# Patient Record
Sex: Female | Born: 1985
Health system: Southern US, Community
[De-identification: ages and names within clinical notes are randomized; demographics above are authoritative.]

## PROBLEM LIST (undated history)

## (undated) DIAGNOSIS — F819 Developmental disorder of scholastic skills, unspecified: Secondary | ICD-10-CM

## (undated) DIAGNOSIS — G4700 Insomnia, unspecified: Secondary | ICD-10-CM

## (undated) DIAGNOSIS — I10 Essential (primary) hypertension: Secondary | ICD-10-CM

## (undated) DIAGNOSIS — E119 Type 2 diabetes mellitus without complications: Secondary | ICD-10-CM

## (undated) DIAGNOSIS — Q040 Congenital malformations of corpus callosum: Secondary | ICD-10-CM

## (undated) DIAGNOSIS — R569 Unspecified convulsions: Secondary | ICD-10-CM

## (undated) DIAGNOSIS — Q039 Congenital hydrocephalus, unspecified: Secondary | ICD-10-CM

## (undated) HISTORY — DX: Essential (primary) hypertension: I10

## (undated) HISTORY — DX: Insomnia, unspecified: G47.00

## (undated) HISTORY — DX: Congenital malformations of corpus callosum: Q04.0

## (undated) HISTORY — DX: Unspecified convulsions: R56.9

## (undated) HISTORY — DX: Congenital hydrocephalus, unspecified: Q03.9

## (undated) HISTORY — PX: WISDOM TOOTH EXTRACTION: SHX21

## (undated) HISTORY — DX: Developmental disorder of scholastic skills, unspecified: F81.9

## (undated) HISTORY — DX: Type 2 diabetes mellitus without complications: E11.9

---
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
## 2006-04-13 ENCOUNTER — Ambulatory Visit: Payer: Self-pay | Admitting: Nurse Practitioner

## 2006-05-10 ENCOUNTER — Ambulatory Visit: Payer: Self-pay | Admitting: Nurse Practitioner

## 2007-04-04 ENCOUNTER — Ambulatory Visit: Payer: Self-pay | Admitting: Family Medicine

## 2007-04-04 ENCOUNTER — Encounter (INDEPENDENT_AMBULATORY_CARE_PROVIDER_SITE_OTHER): Payer: Self-pay | Admitting: Nurse Practitioner

## 2007-04-04 LAB — CONVERTED CEMR LAB
ALT: 17 units/L (ref 0–35)
Alkaline Phosphatase: 108 units/L (ref 39–117)
Basophils Absolute: 0 10*3/uL (ref 0.0–0.1)
Basophils Relative: 0 % (ref 0–1)
Lymphocytes Relative: 32 % (ref 12–46)
Lymphs Abs: 2.4 10*3/uL (ref 0.7–4.0)
MCHC: 32.3 g/dL (ref 30.0–36.0)
MCV: 84.7 fL (ref 78.0–100.0)
Monocytes Absolute: 0.5 10*3/uL (ref 0.1–1.0)
Neutro Abs: 4.7 10*3/uL (ref 1.7–7.7)
Neutrophils Relative %: 62 % (ref 43–77)
Platelets: 261 10*3/uL (ref 150–400)
Potassium: 4.3 meq/L (ref 3.5–5.3)
RDW: 13.4 % (ref 11.5–15.5)
Total Bilirubin: 0.3 mg/dL (ref 0.3–1.2)
Total Protein: 8 g/dL (ref 6.0–8.3)
WBC: 7.6 10*3/uL (ref 4.0–10.5)

## 2007-04-24 ENCOUNTER — Ambulatory Visit: Payer: Self-pay | Admitting: Internal Medicine

## 2007-04-24 ENCOUNTER — Encounter (INDEPENDENT_AMBULATORY_CARE_PROVIDER_SITE_OTHER): Payer: Self-pay | Admitting: Nurse Practitioner

## 2007-04-24 LAB — CONVERTED CEMR LAB
Cholesterol: 241 mg/dL — ABNORMAL HIGH (ref 0–200)
HDL: 54 mg/dL (ref >39)
LDL Cholesterol: 165 mg/dL — ABNORMAL HIGH (ref 0–99)
Total CHOL/HDL Ratio: 4.5
Triglycerides: 112 mg/dL (ref <150)
VLDL: 22 mg/dL (ref 0–40)

## 2008-11-10 ENCOUNTER — Encounter (INDEPENDENT_AMBULATORY_CARE_PROVIDER_SITE_OTHER): Payer: Self-pay | Admitting: Internal Medicine

## 2008-11-10 ENCOUNTER — Ambulatory Visit: Payer: Self-pay | Admitting: Family Medicine

## 2008-11-10 LAB — CONVERTED CEMR LAB
AST: 13 units/L (ref 0–37)
Alkaline Phosphatase: 106 units/L (ref 39–117)
Carbamazepine Lvl: 8.8 ug/mL (ref 4.0–12.0)
Chloride: 107 meq/L (ref 96–112)
Creatinine, Ser: 0.65 mg/dL (ref 0.40–1.20)
Glucose, Bld: 177 mg/dL — ABNORMAL HIGH (ref 70–99)
Hemoglobin: 12.1 g/dL (ref 12.0–15.0)
Lymphs Abs: 3.8 10*3/uL (ref 0.7–4.0)
Monocytes Absolute: 0.8 10*3/uL (ref 0.1–1.0)
Neutrophils Relative %: 66 % (ref 43–77)
Potassium: 3.8 meq/L (ref 3.5–5.3)
RDW: 13.1 % (ref 11.5–15.5)
Sodium: 142 meq/L (ref 135–145)
Total Bilirubin: 0.2 mg/dL — ABNORMAL LOW (ref 0.3–1.2)
Total Protein: 8.3 g/dL (ref 6.0–8.3)
WBC: 13.6 10*3/uL — ABNORMAL HIGH (ref 4.0–10.5)

## 2008-11-28 ENCOUNTER — Ambulatory Visit: Payer: Self-pay | Admitting: Internal Medicine

## 2008-11-28 ENCOUNTER — Encounter (INDEPENDENT_AMBULATORY_CARE_PROVIDER_SITE_OTHER): Payer: Self-pay | Admitting: Internal Medicine

## 2008-11-28 LAB — CONVERTED CEMR LAB
Basophils Absolute: 0.1 10*3/uL (ref 0.0–0.1)
Basophils Relative: 1 % (ref 0–1)
Eosinophils Absolute: 0 10*3/uL (ref 0.0–0.7)
Eosinophils Relative: 0 % (ref 0–5)
Lymphocytes Relative: 40 % (ref 12–46)
MCHC: 32.6 g/dL (ref 30.0–36.0)
MCV: 84.5 fL (ref 78.0–100.0)
Monocytes Absolute: 0.9 10*3/uL (ref 0.1–1.0)
Neutro Abs: 6.7 10*3/uL (ref 1.7–7.7)
Platelets: 263 10*3/uL (ref 150–400)
WBC: 12.7 10*3/uL — ABNORMAL HIGH (ref 4.0–10.5)

## 2009-01-17 HISTORY — PX: SHUNT REPLACEMENT: SHX5403

## 2009-02-16 ENCOUNTER — Ambulatory Visit: Payer: Self-pay | Admitting: Internal Medicine

## 2009-02-19 ENCOUNTER — Ambulatory Visit: Payer: Self-pay | Admitting: Internal Medicine

## 2009-02-19 LAB — CONVERTED CEMR LAB
AST: 23 units/L (ref 0–37)
CO2: 22 meq/L (ref 19–32)
Calcium: 9 mg/dL (ref 8.4–10.5)
Cholesterol: 187 mg/dL (ref 0–200)
Glucose, Bld: 89 mg/dL (ref 70–99)
LDL Cholesterol: 113 mg/dL — ABNORMAL HIGH (ref 0–99)
Potassium: 4.1 meq/L (ref 3.5–5.3)
Total Bilirubin: 0.4 mg/dL (ref 0.3–1.2)
Total CHOL/HDL Ratio: 3.5

## 2009-03-21 ENCOUNTER — Emergency Department (HOSPITAL_COMMUNITY): Admission: EM | Admit: 2009-03-21 | Discharge: 2009-03-22 | Payer: Self-pay | Admitting: Emergency Medicine

## 2009-05-31 ENCOUNTER — Encounter: Payer: Self-pay | Admitting: Emergency Medicine

## 2009-05-31 ENCOUNTER — Inpatient Hospital Stay (HOSPITAL_COMMUNITY): Admission: EM | Admit: 2009-05-31 | Discharge: 2009-06-05 | Payer: Self-pay | Admitting: Emergency Medicine

## 2009-06-10 ENCOUNTER — Emergency Department (HOSPITAL_COMMUNITY): Admission: EM | Admit: 2009-06-10 | Discharge: 2009-06-11 | Payer: Self-pay | Admitting: Emergency Medicine

## 2009-06-23 ENCOUNTER — Inpatient Hospital Stay (HOSPITAL_COMMUNITY): Admission: EM | Admit: 2009-06-23 | Discharge: 2009-06-25 | Payer: Self-pay | Admitting: Emergency Medicine

## 2009-08-11 ENCOUNTER — Ambulatory Visit: Payer: Self-pay | Admitting: Family Medicine

## 2010-04-05 LAB — POCT I-STAT, CHEM 8
Calcium, Ion: 1.15 mmol/L (ref 1.12–1.32)
Creatinine, Ser: 0.4 mg/dL (ref 0.4–1.2)
Glucose, Bld: 166 mg/dL — ABNORMAL HIGH (ref 70–99)
Hemoglobin: 11.9 g/dL — ABNORMAL LOW (ref 12.0–15.0)
Potassium: 3.7 mEq/L (ref 3.5–5.1)

## 2010-04-05 LAB — BASIC METABOLIC PANEL
BUN: 6 mg/dL (ref 6–23)
BUN: 4 mg/dL — ABNORMAL LOW (ref 6–23)
BUN: 11 mg/dL (ref 6–23)
CO2: 24 mEq/L (ref 19–32)
Calcium: 9.1 mg/dL (ref 8.4–10.5)
Chloride: 110 mEq/L (ref 96–112)
GFR calc non Af Amer: >60 mL/min (ref >60)
GFR calc non Af Amer: >60 mL/min (ref >60)
Glucose, Bld: 141 mg/dL — ABNORMAL HIGH (ref 70–99)
Glucose, Bld: 141 mg/dL — ABNORMAL HIGH (ref 70–99)
Glucose, Bld: 140 mg/dL — ABNORMAL HIGH (ref 70–99)
Potassium: 3.8 mEq/L (ref 3.5–5.1)
Potassium: 3.6 mEq/L (ref 3.5–5.1)
Potassium: 3.8 mEq/L (ref 3.5–5.1)
Sodium: 137 mEq/L (ref 135–145)
Sodium: 143 mEq/L (ref 135–145)

## 2010-04-05 LAB — DIFFERENTIAL
Basophils Absolute: 0.1 10*3/uL (ref 0.0–0.1)
Basophils Relative: 0 % (ref 0–1)
Eosinophils Absolute: 0 10*3/uL (ref 0.0–0.7)
Eosinophils Relative: 0 % (ref 0–5)
Lymphocytes Relative: 15 % (ref 12–46)
Lymphs Abs: 1.8 10*3/uL (ref 0.7–4.0)
Lymphs Abs: 2.6 10*3/uL (ref 0.7–4.0)
Monocytes Absolute: 0.8 10*3/uL (ref 0.1–1.0)
Monocytes Absolute: 0.9 10*3/uL (ref 0.1–1.0)
Monocytes Absolute: 1.2 10*3/uL — ABNORMAL HIGH (ref 0.1–1.0)
Monocytes Relative: 7 % (ref 3–12)
Monocytes Relative: 7 % (ref 3–12)
Neutro Abs: 11.7 10*3/uL — ABNORMAL HIGH (ref 1.7–7.7)
Neutrophils Relative %: 81 % — ABNORMAL HIGH (ref 43–77)
Neutrophils Relative %: 75 % (ref 43–77)

## 2010-04-05 LAB — GRAM STAIN

## 2010-04-05 LAB — CSF CULTURE W GRAM STAIN: Gram Stain: NONE SEEN

## 2010-04-05 LAB — URINALYSIS, ROUTINE W REFLEX MICROSCOPIC
Bilirubin Urine: NEGATIVE
Glucose, UA: NEGATIVE mg/dL
Hgb urine dipstick: NEGATIVE
Hgb urine dipstick: NEGATIVE
Nitrite: NEGATIVE
Protein, ur: NEGATIVE mg/dL
Specific Gravity, Urine: 1.017 (ref 1.005–1.030)
Urobilinogen, UA: 1 mg/dL (ref 0.0–1.0)
pH: 5.5 (ref 5.0–8.0)

## 2010-04-05 LAB — CBC
HCT: 38.4 % (ref 36.0–46.0)
HCT: 34.8 % — ABNORMAL LOW (ref 36.0–46.0)
HCT: 36.3 % (ref 36.0–46.0)
HCT: 39 % (ref 36.0–46.0)
Hemoglobin: 11.7 g/dL — ABNORMAL LOW (ref 12.0–15.0)
Hemoglobin: 12.5 g/dL (ref 12.0–15.0)
MCV: 85 fL (ref 78.0–100.0)
MCV: 85.2 fL (ref 78.0–100.0)
MCV: 85 fL (ref 78.0–100.0)
Platelets: 262 10*3/uL (ref 150–400)
RBC: 4.57 MIL/uL (ref 3.87–5.11)
RBC: 4.08 MIL/uL (ref 3.87–5.11)
RDW: 13.5 % (ref 11.5–15.5)
RDW: 13.6 % (ref 11.5–15.5)
RDW: 14.1 % (ref 11.5–15.5)
WBC: 14.4 10*3/uL — ABNORMAL HIGH (ref 4.0–10.5)
WBC: 14.6 10*3/uL — ABNORMAL HIGH (ref 4.0–10.5)
WBC: 16.8 10*3/uL — ABNORMAL HIGH (ref 4.0–10.5)

## 2010-04-05 LAB — GLUCOSE, CAPILLARY
Glucose-Capillary: 99 mg/dL (ref 70–99)
Glucose-Capillary: 108 mg/dL — ABNORMAL HIGH (ref 70–99)
Glucose-Capillary: 109 mg/dL — ABNORMAL HIGH (ref 70–99)
Glucose-Capillary: 112 mg/dL — ABNORMAL HIGH (ref 70–99)
Glucose-Capillary: 117 mg/dL — ABNORMAL HIGH (ref 70–99)
Glucose-Capillary: 125 mg/dL — ABNORMAL HIGH (ref 70–99)
Glucose-Capillary: 126 mg/dL — ABNORMAL HIGH (ref 70–99)
Glucose-Capillary: 132 mg/dL — ABNORMAL HIGH (ref 70–99)
Glucose-Capillary: 134 mg/dL — ABNORMAL HIGH (ref 70–99)
Glucose-Capillary: 144 mg/dL — ABNORMAL HIGH (ref 70–99)
Glucose-Capillary: 144 mg/dL — ABNORMAL HIGH (ref 70–99)
Glucose-Capillary: 148 mg/dL — ABNORMAL HIGH (ref 70–99)
Glucose-Capillary: 175 mg/dL — ABNORMAL HIGH (ref 70–99)
Glucose-Capillary: 212 mg/dL — ABNORMAL HIGH (ref 70–99)
Glucose-Capillary: 228 mg/dL — ABNORMAL HIGH (ref 70–99)
Glucose-Capillary: 188 mg/dL — ABNORMAL HIGH (ref 70–99)

## 2010-04-05 LAB — CSF CELL COUNT WITH DIFFERENTIAL: Tube #: 4

## 2010-04-05 LAB — PROTIME-INR: INR: 1.14 (ref 0.00–1.49)

## 2010-04-05 LAB — URINE MICROSCOPIC-ADD ON

## 2010-04-05 LAB — MRSA PCR SCREENING: MRSA by PCR: NEGATIVE

## 2010-04-12 LAB — POCT I-STAT, CHEM 8
BUN: 11 mg/dL (ref 6–23)
Creatinine, Ser: 0.5 mg/dL (ref 0.4–1.2)
Glucose, Bld: 121 mg/dL — ABNORMAL HIGH (ref 70–99)
Hemoglobin: 11.9 g/dL — ABNORMAL LOW (ref 12.0–15.0)
Sodium: 140 mEq/L (ref 135–145)
TCO2: 24 mmol/L (ref 0–100)

## 2010-04-12 LAB — GLUCOSE, CAPILLARY: Glucose-Capillary: 168 mg/dL — ABNORMAL HIGH (ref 70–99)

## 2010-06-04 NOTE — H&P (Signed)
NAME:  Katelyn [name], Katelyn [name]                       ACCOUNT NO.:  [id number]   MEDICAL RECORD NO.:  [id number]                   PATIENT TYPE:  EMS   LOCATION:  MAJO                                 FACILITY:  [facility]   PHYSICIAN:  Payton Doughty, M.D.                   DATE OF BIRTH:  02/13/1985   DATE OF ADMISSION:  04/15/2002  DATE OF DISCHARGE:                                HISTORY & PHYSICAL   ADMISSION DIAGNOSES:  Shunt malfunction.   HISTORY OF PRESENT ILLNESS:  This is a 25 year old, right-handed, black  child that was born a month prematurely.  She had a shunt placed in [place],  [place], some 15 years ago and has had no difficulty since then.  She  has a seizure disorder.  Her mother had noted what she calls increased  seizures.  She was seen by Deanna Artis. Hickling, M.D., a week ago.  Today,  she had a witnessed seizure that lasted 30 minutes at school.  They put her  on the bus and sent her home.  She had another seizure on the bus which  lasted 30 minutes.  They were described as her normal seizures, but longer  in duration.  She was moving both sides instead of one side which was her  normal seizure pattern.  She came to the emergency room.  CT scanning,  although technically well done, was not a lot of help because of the unknown  history, but is shows basically agenesis of the corpus callosum, a high-  riding cerebellum, and a shunt in place in a common ventricular cavity.  The  shunt series demonstrated probable disconnection in the neck.   PAST MEDICAL HISTORY:  Otherwise remarkable for the seizures for which she  is on Tegretol and a little bit of hypertension for which she is on  lisinopril and hydrochlorothiazide (just started).  She is also on a new  seizure medicine that her mother does not know the name of and she has not  started yet.   ALLERGIES:  She does not have any allergies.   PAST SURGICAL HISTORY:  Her only operation has been her shunt.   SOCIAL  HISTORY:  She does not smoke or drink.  She functions at about the  level of a 24 year old.  She is in a special school.   PHYSICAL EXAMINATION:  HEENT:  Papilledema bilaterally.  She does not  complain of a headache.  NECK:  Supple.  There is a palpable defect in the shunt tubing in the  cervical area.  CHEST:  Clear.  CARDIAC:  Regular rate and rhythm.  ABDOMEN:  Nontender.  NEUROLOGIC:  She awakens, looks around, and answers simple questions.  Her  pupils are equal, round, and reactive to light. She appears to have good  extraocular movements, but with some paresis of upgaze.  Her facial movement  is intact.  On  motor exam, she can move all fours, but she follows commands  very sluggishly.   Her shunt series shows disconnection as noted above.    CLINICAL IMPRESSION:  Probable shunt malfunction.  Certainly with the  disconnection and papilledema, it probably needs to be reexplored and a new  shunt placed.  I think that should be done urgently.  The risks and benefits  of this approach have been discussed with her mother and she wishes to  proceed.                                               Payton Doughty, M.D.    MWR/MEDQ  D:  04/15/2002  T:  04/15/2002  Job:  [id number]

## 2010-06-04 NOTE — Consult Note (Signed)
NAME:  Katelyn [name], Katelyn [name]                       ACCOUNT NO.:  [id number]   MEDICAL RECORD NO.:  [id number]                   PATIENT TYPE:  INP   LOCATION:  3172                                 FACILITY:  [facility]   PHYSICIAN:  Genene Churn. Love, M.D.                 DATE OF BIRTH:  11-18-1985   DATE OF CONSULTATION:  04/15/2002  DATE OF DISCHARGE:                                   CONSULTATION   REASON FOR ADMISSION:  This 25 year old mentally retarded black female was  admitted from the emergency room for evaluation of recurrent seizures and  shunt failure in treatment of hydrocephalus.   HISTORY OF PRESENT ILLNESS:  The patient was born a 9 pound product of a  premature pregnancy. She was delivered by cesarean section because of known  hydrocephalus at birth. It was initially not suspected that she would  survive, but she underwent a shunt at 74 days of age. She has had this shunt  for 15 years.   She had slow developmental motor milestones, walking at 1-1/2 years, talking  at 3 to 4 years and attending the 9th grade in special education. She knows  her numbers and colors but cannot read or write except for her name. She  does dress herself, feed herself, bathe herself and take care of her toilet  needs.   She began having seizures at 22 months  of age while she and her mother  lived in [place], [place]. At that time she was placed on phenobarbital.  After moving to [facility] in 1996, she was changed by Dr. Sharene Skeans to  Tegretol. She has been having  seizures about once per month at the time of  her menstrual cycle, but more recently the seizures have increased to as  frequently as 2 times per week. Each seizure would usually last 2 to 3  minutes. This day she had 3 seizures, one lasting 10 minutes at school, one  lasting a few minutes in the bus and one lasting probably 30 minutes in the  bus.   She had been scheduled by [facility], her primary care physicians, to have  a   neurosurgical consultation on April 22, 2002, but came to the emergency  room with her seizures. A CT scan showed disconnection of the shunt and she  was seen by the neurosurgeons who felt that she had papilledema. Her  capillary blood glucose was 176. The patient has not been complaining of  headaches or nausea or vomiting. She is now status post shunt revision in  the left neck and skull.   PAST MEDICAL HISTORY:  1. Hydrocephalus.  2. Mental retardation.  3. Seizures.  4. Hypertension.   PHYSICAL EXAMINATION:  NEUROLOGIC:  A well developed black female who was in  the recovery room. She had an unusual shaped head with multiple scars and  had had the left side of her head shaved. Mental status:  She would give her  name and she would follow some commands. She was not oriented to year or to  month. Her cranial nerve examination revealed that both disks were seen and  I thought they were flat. Pupils reacted from 3 to 2.5 bilaterally and were  slowly reactive. The extraocular movements revealed an exotropia but she had  full doll's eyes. She may have had some decreased duck gaze. Tongue was  midline. Uvula was midline. Gags were present. She moved all extremities.  She felt pinprick. Deep tendon reflexes were 2+. Plantar responses were  downgoing.   LABORATORY DATA:  White blood cell count 16,600, hemoglobin 11.5, hematocrit  34.2, platelets 279, 000. Sodium 139, potassium 3.7, chloride 103, CO2  content 24, BUN 10, creatinine 0.7, glucose 166, alkaline phosphatase 132.  Liver function tests normal. Carba______ level 7.1.   IMPRESSION:  1. Hydrocephalus, code 331.4.  2. Multiple seizures, code 345.1.  3. Shunt failure, code 999.4.  4. Mental retardation, code 318.0.  5. Hypertension, code 796.2.   PLAN:  The plan at this time is to restart her Tegretol to 300 mg in the  morning and 600 mg q.h.s. She will be followed by Dr. Sharene Skeans.                                                Genene Churn. Sandria Manly, M.D.    JML/MEDQ  D:  04/15/2002  T:  04/16/2002  Job:  [id number]   cc:   Payton Doughty, M.D.  [address]  [place]  [place] [address]  Fax: [phone]   Deanna Artis. Sharene Skeans, M.D.  [address]  [address]  [place]  [place] [address]  Fax: [phone]

## 2010-06-04 NOTE — Op Note (Signed)
   NAME:  Katelyn [name], Katelyn [name]                       ACCOUNT NO.:  [id number]   MEDICAL RECORD NO.:  [id number]                   PATIENT TYPE:  INP   LOCATION:  3172                                 FACILITY:  [facility]   PHYSICIAN:  Payton Doughty, M.D.                   DATE OF BIRTH:  Mar 29, 1985   DATE OF PROCEDURE:  04/15/2002  DATE OF DISCHARGE:                                 OPERATIVE REPORT   PREOPERATIVE DIAGNOSIS:  Disconnected shunt in the neck.   POSTOPERATIVE DIAGNOSIS:  Disconnected shunt in the neck.   OPERATIVE PROCEDURE:  Ventriculoperitoneal shunt revision.   SURGEON:  Payton Doughty, M.D.   ANESTHESIA:  General endotracheal   PREP:  Sterile Betadine prep and scrub with alcohol wipe.   COMPLICATIONS:  None.   NURSE ASSISTANT:  [place].   PROCEDURE:  This is a 26 year old girl with a left occipital to peritoneal  shunt with radiographically documented disconnection in the neck.  She is  taken to the operating room, smoothly anesthetized, intubated, and placed in  the supine position.  The left shoulder was bumped up and the head turned  towards the right.  Following shave, prep and drape in the usual sterile  fashion, the skin was incised over the tubing just distal to the valve and  over the tubing at the clavicle.  Both of these were mobilized.  There was  good egress of CSF from the tubing connected to the valve.  The peritoneal  end was connected to a manometer, and it dropped from 40 cm down to 5 cm of  water very rapidly and then cycled thereafter with respirations, suggesting  good patency of drainage of the peritoneal tubing.  A short segment of new  peritoneal tubing was tunneled between the two mobilized segments, and  double female connectors were used to connect to the tubing distal to the  valve and the proximal end of the peritoneal tubing.  It was secured there  with 2-0 silks and tied together.  The wounds were irrigated. Hemostasis was  assured.  The skin  was closed with successive layers of 3-0 Vicryl and 3-0  nylon. Betadine Telfa dressing was applied, and the patient was returned to  the recovery room in good condition.                                               Payton Doughty, M.D.    MWR/MEDQ  D:  04/15/2002  T:  04/16/2002  Job:  [id number]

## 2012-02-05 ENCOUNTER — Encounter: Payer: Self-pay | Admitting: Family Medicine

## 2012-02-05 DIAGNOSIS — G40909 Epilepsy, unspecified, not intractable, without status epilepticus: Secondary | ICD-10-CM | POA: Insufficient documentation

## 2012-02-05 DIAGNOSIS — Q039 Congenital hydrocephalus, unspecified: Secondary | ICD-10-CM | POA: Insufficient documentation

## 2012-02-06 ENCOUNTER — Ambulatory Visit: Payer: Self-pay | Admitting: Family Medicine

## 2012-02-13 ENCOUNTER — Ambulatory Visit (INDEPENDENT_AMBULATORY_CARE_PROVIDER_SITE_OTHER): Payer: Medicare Other | Admitting: Family Medicine

## 2012-02-13 ENCOUNTER — Encounter: Payer: Self-pay | Admitting: Family Medicine

## 2012-02-13 VITALS — BP 137/94 | HR 118 | Ht 63.78 in | Wt 174.0 lb

## 2012-02-13 DIAGNOSIS — I10 Essential (primary) hypertension: Secondary | ICD-10-CM | POA: Insufficient documentation

## 2012-02-13 DIAGNOSIS — G40909 Epilepsy, unspecified, not intractable, without status epilepticus: Secondary | ICD-10-CM

## 2012-02-13 DIAGNOSIS — E119 Type 2 diabetes mellitus without complications: Secondary | ICD-10-CM

## 2012-02-13 DIAGNOSIS — Z23 Encounter for immunization: Secondary | ICD-10-CM

## 2012-02-13 MED ORDER — METOPROLOL SUCCINATE ER 100 MG PO TB24: 100.0000 mg | ORAL_TABLET | Freq: Every day | ORAL | Status: DC

## 2012-02-13 NOTE — Assessment & Plan Note (Signed)
HTN is well-controlled on current regimen. - Metoprolol needs refill today - Consider adding ACE-i in patient with HTN and DM for renoprotection at f/u visit

## 2012-02-13 NOTE — Assessment & Plan Note (Signed)
A1c has not been checked since August.  Pt on metoprolol. - Check A1c today and call patient with result.  - Continue metoprolol at current dose, and change medication management based on today's A1c - Monofilament test normal; last opthalmologic exam Mar 2013 normal - re-perform Mar 2014. - Trim toenail and monitor foot health - Consider adding ACE-I for renoprotection in pt with DM and HTN at f/u - Check lipids at f/u if not checked this year.

## 2012-02-13 NOTE — Assessment & Plan Note (Signed)
Seizures described to be at baseline, and patient is seeing neurologist annually and on carbatrol. - Continue medication at current dose. - Continue to f/u with neurologist

## 2012-02-13 NOTE — Patient Instructions (Addendum)
It was great to meet you today.  Blood pressure is well-controlled, and I am refilling your metoprolol today.  Diabetes - we are checking an A1c today, and I want her to have an eye exam in Mar 2014 as you are planning. Her foot exam was normal today, though I do recommend keeping toenails trimmed.  For her seizures, continue the medication and see neurologist sooner if needed.  It seems like they are under control.  She is getting a flu shot today. Make an appointment to see me again for a pap smear.

## 2012-02-13 NOTE — Progress Notes (Signed)
Subjective:     Patient ID: Katelyn [name], female   DOB: 11/24/1985, 27 y.o.   MRN: [id number]  CC - New patient  HPI  Alda Gaultney, pronounced "Lovenia Kim" is a 26 y.o. female with history of congenital hydrocephalus, seizure disorder, DM, and HTN here to establish care.  Her mom accompanies her and helps provide history.  Per mother, complaints today include acne on her back, 1 seizure per month, and left great toe swelling.    1. Seizures: Pt has a 2-5 minute left-sided partial seizure once monthly around the time of her period, at baseline.  She sees a neurologist and is taking carbatrol 600mg  BID.  She sees a neurologist about once annually and was last seen in September.  Mom denies current changes or new issues but knows she can take patient sooner than this September if she has any concerns.  2. Left great toe swelling: Per mom, noticed left great toe swelling/bruising at nail 2 days ago and skin peeling.  Pt denies pain or numbness.  Currently, no swelling.  3. HTN - Takes metoprolol regularly.  4. DM - Does not recall any symptoms of hyperglycemia and takes metformin regularly.  PMH: DM HTN Born with hydrocephalus Seizure disorder since 40 mo old, currently monthly  Medications: Metoprolol ER 100mg  / day Metformin 1000 mg twice daily [name] 600mg  BID  Allergies: None  Surgical hx: Shunt replaced 2011 June Dental surgery 2 weeks ago wisdom teeth removal  Hospitalizations: shunt replacement - [facility]  LMP: Ended 1 week ago Monthly and regular Never had pap smear Never has been pregnant  FH:  Cancer - maternal GM and GGM Maternal GF heart disease died heart attack at 34 Maternal GM thyroid disease Maternal uncle and GM with DM Maternal HTN Father HTN and DM  SH: Lives with mother. Denies pets. Denies smoke exposure Denies tobacco, drugs, or alcohol use Hobbies: Mostly listens to music, does lots of writing, activity books, goes shopping Went to  [employer] until 27 years old. Brushes own teeth; mom helps her bathe; fixes own drinks; mom prepares meals - takes care of self and mom helps. Denies sexual relationship. Denies concern for STDs.  Review of Systems Complains of needing glasses, baseline mild imbalance, and acne on back.  Denies dysuria, nausea, vomiting, constipation, diarrhea, stomach pain, fevers/chills, rash, or difficulty speaking.  Objective:   Physical Exam BP 137/94  Pulse 118  Ht 5' 3.78" (1.62 m)  Wt 174 lb (78.926 kg)  BMI 30.07 kg/m2 GEN: NAD CV: RRR, no murmurs, rubs, gallops PULM: CTAB, not fully participating EXTR: No LE edema or clubbing; Left great toe with a toenail growing into the tip of the toe SKIN: Back with healing acne scars; no rash NEURO: Alert, Monofilament normal bilaterally, slowed response to questions HEENT: Large forehead    Assessment:     Katelyn [name], pronounced "Lovenia Kim" is a 27 y.o. female with history of congenital hydrocephalus, seizure disorder, DM, and HTN here to establish care.     Plan:     # Health Maintenance -  - Flu shot given today - Schedule f/u for pap smear/chronic disease management

## 2012-02-15 ENCOUNTER — Telehealth: Payer: Self-pay | Admitting: Family Medicine

## 2012-02-15 NOTE — Telephone Encounter (Signed)
Called and spoke with Katelyn [name]'s mother about HgbA1c 7.0.  Was 5.9 02/2009, and mother is unsure what it has been since then until now.    Discussed continuing metformin at current 1000mg  BID and working on diet and exercise.  Mom plans to try and get a grant for Katelyn [name] to go to a gym as she has previously had success with water aerobics classes.  Discussed benefit of incorporating exercise into her daily life.  Mom also plans to try cutting back fatty foods and states Katelyn [name] already prefers water to sweet beverages.  - F/u on diet/exercise at f/u visit. - Consider diet/exercise counseling with Dr. Gerilyn Pilgrim at f/u vs visit just to discuss diet/exercise with me.

## 2012-03-23 ENCOUNTER — Ambulatory Visit: Payer: Medicare Other | Admitting: Family Medicine

## 2012-04-03 ENCOUNTER — Ambulatory Visit: Payer: Medicare Other | Admitting: Family Medicine

## 2012-04-19 ENCOUNTER — Ambulatory Visit (INDEPENDENT_AMBULATORY_CARE_PROVIDER_SITE_OTHER): Payer: Medicare Other | Admitting: Family Medicine

## 2012-04-19 ENCOUNTER — Encounter: Payer: Self-pay | Admitting: Family Medicine

## 2012-04-19 ENCOUNTER — Other Ambulatory Visit (HOSPITAL_COMMUNITY)
Admission: RE | Admit: 2012-04-19 | Discharge: 2012-04-19 | Disposition: A | Discharge status: Home / Self Care | Payer: Medicare Other | Source: Ambulatory Visit | Attending: Family Medicine | Admitting: Family Medicine

## 2012-04-19 VITALS — BP 148/99 | HR 111 | Temp 98.1°F | Ht 63.75 in | Wt 176.0 lb

## 2012-04-19 DIAGNOSIS — E119 Type 2 diabetes mellitus without complications: Secondary | ICD-10-CM

## 2012-04-19 DIAGNOSIS — R141 Gas pain: Secondary | ICD-10-CM

## 2012-04-19 DIAGNOSIS — R140 Abdominal distension (gaseous): Secondary | ICD-10-CM

## 2012-04-19 DIAGNOSIS — Z124 Encounter for screening for malignant neoplasm of cervix: Secondary | ICD-10-CM

## 2012-04-19 DIAGNOSIS — Z0000 Encounter for general adult medical examination without abnormal findings: Secondary | ICD-10-CM

## 2012-04-19 DIAGNOSIS — I10 Essential (primary) hypertension: Secondary | ICD-10-CM

## 2012-04-19 LAB — BASIC METABOLIC PANEL
BUN: 11 mg/dL (ref 6–23)
CO2: 25 mEq/L (ref 19–32)
Calcium: 9.4 mg/dL (ref 8.4–10.5)
Glucose, Bld: 143 mg/dL — ABNORMAL HIGH (ref 70–99)
Sodium: 136 mEq/L (ref 135–145)

## 2012-04-19 MED ORDER — LISINOPRIL 10 MG PO TABS: 10.0000 mg | ORAL_TABLET | Freq: Every day | ORAL | Status: DC

## 2012-04-19 MED ORDER — METFORMIN HCL 1000 MG PO TABS: 1000.0000 mg | ORAL_TABLET | Freq: Two times a day (BID) | ORAL | Status: DC

## 2012-04-19 NOTE — Patient Instructions (Addendum)
It was good to see you today Katelyn [name].  - You got a pap smear today. I will call you if these results are abnormal or send a letter if they are normal.  - For your blood pressure, I think we should collect a basic metabolic panel since the last one I have in our records was 3 years ago. I will call you if this is abnormal. - We will start another blood pressure medication to keep your kidneys healthy. - Continue working on diet and exercise. We can discuss this further at the next visit. Keep a log of the exercise you do and what you eat and bring it next time. - Also, bring all of your medications whenever you have an appointment.  - I think your belly looks okay today, but I notice it looks a little distended.  Otherwise, your exam is normal and since you feel normal, I don't think there is any emergency today. However, I recommend following up with your surgeon to take a look at it soon.  - I refilled metformin today.  - Make a nurse and lab only follow up visit in 2 weeks to check on your blood pressure and some labs. Come fasting (nothing other than water or black coffee). - Then you can see me a week or two later.

## 2012-04-21 ENCOUNTER — Encounter: Payer: Self-pay | Admitting: Family Medicine

## 2012-04-21 DIAGNOSIS — Z0000 Encounter for general adult medical examination without abnormal findings: Secondary | ICD-10-CM | POA: Insufficient documentation

## 2012-04-21 DIAGNOSIS — R140 Abdominal distension (gaseous): Secondary | ICD-10-CM | POA: Insufficient documentation

## 2012-04-21 NOTE — Assessment & Plan Note (Signed)
Possibly gaseous, but with h/o intraabdominal surgery, adhesions and (partial) obstruction possible, though unlikely with hx and exam. - Recommended nonemergent but closer follow up with patient's surgeon - Return precautions discussed

## 2012-04-21 NOTE — Progress Notes (Signed)
Subjective:     Patient ID: Katelyn [name], female   DOB: 13-Aug-1985, 27 y.o.   MRN: [id number]  CC - Complete physical exam with pap smear  HPI  Katelyn [name] is a 27 y.o. female with h/o congenital hydrocephalus, DM, HTN, and seizure disorder here for a complete physical exam with pap smear.  1. Mom also notes that Katelyn [name]'s stomach seems a bit bloated/larger than normal but denies redness, fevers, chills, appetite changes, menstrual changes (LMP ended last Sat) or sexual activity. She does not think Katelyn [name] has been in pain or has other complaints. Katelyn [name] does not feel uncomfortable either. She denies risk of STD or pregnancy, stating Katelyn [name] is not sexually active.   2. DM and HTN - Katelyn [name] has had increased urinary frequency but no burning. She is taking metformin and metoprolol daily. She walks daily with mom and they have tried decreasing fried foods to once weekly. Dietary habits include lots of fruits ,2% milk, high fiber diet, and few unhealthy snacks.  Review of Systems - Per HPI  PMH, SH, and FH reviewed with no changes     Objective:   Physical Exam BP 148/99  Pulse 111  Temp(Src) 98.1 F (36.7 C) (Oral)  Ht 5' 3.75" (1.619 m)  Wt 176 lb (79.833 kg)  BMI 30.46 kg/m2  LMP 04/10/2012  GEN: NAD, sitting on exam table HEENT: Hydrocephalus with very large forehead, EOMI, MMM, Sclera clear CV: RRR, no m/r/g ABD: soft, nontender, mildly distended above umbilicus with scars from previous surgery, NABS GU: Normal external genitalia, cervix visualized and wnl on speculum exam, bimanual exam WNL with no adnexal or cervical tenderness or masses, mild amount of bleeding after exam     Assessment:     Katelyn [name] is a 27 y.o. female with h/o congenital hydrocephalus, DM, HTN, and seizure disorder here for a complete physical exam with pap smear.    Plan:

## 2012-04-21 NOTE — Assessment & Plan Note (Addendum)
With urinary frequency, possibly suboptimal DM control. Last A1c <3 months ago, so will wait and recheck in 2 weeks  - Check A1c and fasting lipid panel at nurse visit in 2 weeks - Has appt later in April to see ophthalmologist

## 2012-04-21 NOTE — Assessment & Plan Note (Addendum)
BP mildly elevated today.  - BMET to check creatinine - Started lisinopril 10mg  daily for renoprotection with DM and for better BP control - If Cr elevated, will discontinue - Return for nurse visit in 2 weeks to check BMET and BP - F/u with me in 1-2 months to f/u on weight loss efforts (asked to bring log). Consider meeting with Dr Gerilyn Pilgrim at that time

## 2012-04-21 NOTE — Assessment & Plan Note (Addendum)
-   Pap smear today - Fasting lipid panel on return in 2 wks

## 2012-04-24 ENCOUNTER — Encounter: Payer: Self-pay | Admitting: Family Medicine

## 2012-05-07 ENCOUNTER — Other Ambulatory Visit: Payer: Medicare Other

## 2012-05-08 ENCOUNTER — Other Ambulatory Visit: Payer: Medicare Other

## 2012-05-10 ENCOUNTER — Encounter: Payer: Self-pay | Admitting: Family Medicine

## 2012-05-15 ENCOUNTER — Other Ambulatory Visit (INDEPENDENT_AMBULATORY_CARE_PROVIDER_SITE_OTHER): Payer: Medicare Other

## 2012-05-15 DIAGNOSIS — E119 Type 2 diabetes mellitus without complications: Secondary | ICD-10-CM

## 2012-05-15 DIAGNOSIS — I10 Essential (primary) hypertension: Secondary | ICD-10-CM

## 2012-05-15 DIAGNOSIS — Z0000 Encounter for general adult medical examination without abnormal findings: Secondary | ICD-10-CM

## 2012-05-15 LAB — BASIC METABOLIC PANEL
CO2: 24 mEq/L (ref 19–32)
Calcium: 9.2 mg/dL (ref 8.4–10.5)
Creat: 0.62 mg/dL (ref 0.50–1.10)
Glucose, Bld: 123 mg/dL — ABNORMAL HIGH (ref 70–99)
Sodium: 142 mEq/L (ref 135–145)

## 2012-05-15 LAB — LIPID PANEL
Cholesterol: 222 mg/dL — ABNORMAL HIGH (ref 0–200)
HDL: 57 mg/dL (ref >39)

## 2012-05-15 LAB — POCT GLYCOSYLATED HEMOGLOBIN (HGB A1C): Hemoglobin A1C: 6.7

## 2012-05-15 NOTE — Progress Notes (Signed)
BMP,FLP AND A1C DONE TODAY Katelyn [name] 

## 2012-05-23 ENCOUNTER — Telehealth: Payer: Self-pay | Admitting: Family Medicine

## 2012-05-23 NOTE — Telephone Encounter (Signed)
Called pt's mother to discuss results.   - While cholesterol is elevated, LDL is not high enough to initiate treatment and pt does not qualify for statin treatment by new guidelines so will hold off for now. ASCVD 10 year risk <1%.  - A1c 6.7 down from 7 at last check. On Metformin 1000mg  BID. Will continue this treatment and discussed with mom that we should continue striving for weight loss. With young age, would also consider adding glipizide at f/u.  Mom understands and is on board with this plan.  Simone Curia 05/23/2012 5:13 PM

## 2012-05-28 ENCOUNTER — Other Ambulatory Visit: Payer: Self-pay | Admitting: Family

## 2012-05-28 DIAGNOSIS — G40319 Generalized idiopathic epilepsy and epileptic syndromes, intractable, without status epilepticus: Secondary | ICD-10-CM

## 2012-05-28 MED ORDER — CARBAMAZEPINE ER 300 MG PO CP12: ORAL_CAPSULE | ORAL | Status: DC

## 2012-05-31 ENCOUNTER — Ambulatory Visit: Payer: Medicare Other | Admitting: Family Medicine

## 2012-06-01 ENCOUNTER — Ambulatory Visit (INDEPENDENT_AMBULATORY_CARE_PROVIDER_SITE_OTHER): Payer: Medicare Other | Admitting: Family Medicine

## 2012-06-01 ENCOUNTER — Encounter: Payer: Self-pay | Admitting: Family Medicine

## 2012-06-01 VITALS — BP 134/92 | HR 111 | Temp 98.1°F | Ht 63.75 in | Wt 167.0 lb

## 2012-06-01 DIAGNOSIS — E119 Type 2 diabetes mellitus without complications: Secondary | ICD-10-CM

## 2012-06-01 DIAGNOSIS — R142 Eructation: Secondary | ICD-10-CM

## 2012-06-01 DIAGNOSIS — Z0000 Encounter for general adult medical examination without abnormal findings: Secondary | ICD-10-CM

## 2012-06-01 DIAGNOSIS — Z23 Encounter for immunization: Secondary | ICD-10-CM

## 2012-06-01 DIAGNOSIS — R140 Abdominal distension (gaseous): Secondary | ICD-10-CM

## 2012-06-01 DIAGNOSIS — E663 Overweight: Secondary | ICD-10-CM

## 2012-06-01 DIAGNOSIS — Z6825 Body mass index (BMI) 25.0-25.9, adult: Secondary | ICD-10-CM

## 2012-06-01 DIAGNOSIS — I10 Essential (primary) hypertension: Secondary | ICD-10-CM

## 2012-06-01 MED ORDER — TETANUS-DIPHTH-ACELL PERTUSSIS 5-2.5-18.5 LF-MCG/0.5 IM SUSP: 0.5000 mL | Freq: Once | INTRAMUSCULAR | Status: DC

## 2012-06-01 NOTE — Progress Notes (Signed)
Patient ID: Quetzali Heinle, female   DOB: 1985/09/26, 27 y.o.   MRN: [id number]  Subjective:    CC: weight loss and BP  HPI: Sadia Belfiore is a 27 y.o. female with h/o obesity, congenital hydrocephalus with seizure disorder, diabetes and hypertension here for follow-up of BP and weight loss.  1. Weight - Pt and mom have been exercising to exercise [employer], eating grapefruit, [place], wheat bread, cutting out mayo, eating sadnwiches, more Baked foods, and alloing themselves 1 cheat day weekly for the last 1 month. Patient has today lost 9 lbs since last visit. Goal: Continue doing cardio for seniors 6d /week and dieting with baked and boiled foods with 1 cheat day.  2. BP - Patient takes lisinopril and metoprolol daily, denies side effects or hypotensive episodes, and mom plans to get a BP cuff to measure Rekita and her own BPs.  3. DM - A1c last checked end of April was 6.7 and patient takes metformin 1000mg  BID. Had discussed control with adding glipizide vs continued weight loss after last visit. Aiming for tighter control due to young age, though A1c right now is quite good.  Review of Systems - Per HPI; all other systems reviewed and negative.  Past Medical History  Diagnosis Date  . Hypertension   . Diabetes mellitus without complication   . Seizures   . Congenital hydrocephalus    SH: No changes FH: No changes     Objective:  Physical Exam BP 134/92  Pulse 111  Temp(Src) 98.1 F (36.7 C)  Ht 5' 3.75" (1.619 m)  Wt 167 lb (75.751 kg)  BMI 28.9 kg/m2 GEN: NAD, seated on exam table, pleasant, well-groomed and well-developed PULM: Normal effort NEURO: Normal speech, awake, alert HEENT: Large head with congenital hydrocephaulus, no changes ABD: Soft, nontender, nondistended Assessment:     Bettyjean Stefanski is a 27 y.o. female with h/o obesity, congenital hydrocephalus with seizure disorder, diabetes and hypertension here for follow-up of BP and weight loss.   Plan:     # Health maintenance:  - TDAP given today - Will send letter to dentist recommending q3 month tooth cleanings given disability per mom request; fax to [phone]  # See problem list for problem-specific plans.

## 2012-06-01 NOTE — Patient Instructions (Addendum)
It was great to see you again!  For your weight loss, you have lost 9 lbs! Hurray!!  - Goal: Continue doing cardio for seniors 6d /week and dieting with baked and boiled foods with 1 cheat day. - Continue the good work  For your blood pressure, - Get a blood pressure cuff - Write down pressures and dates and bring it next time  For her diabetes,  - Continue metformin and we will not add anything today. - Weight loss will help the control. Keep it up!  I will send the dentist a letter for cleanings every 3 months. We did a tetanus shot today.  As you leave, make an appointment to follow up with me in 3 months.  - Bring all medications in a bag to your visits. - Bring a log of of her blood pressures to the next visit.   Take care and seek immediate care sooner than 3 months if you develop any concerns.

## 2012-06-02 DIAGNOSIS — E669 Obesity, unspecified: Secondary | ICD-10-CM | POA: Insufficient documentation

## 2012-06-02 NOTE — Assessment & Plan Note (Addendum)
Patient has lost 9 lbs since last visit. BMI now <30, now overweight and not obese category. At this rate, can achieve better diabetes control with weight loss.  - Congratulated and discussed weight loss goals (see overweight problem) - Will hold off on adding glipizide at this time. Continue metformin 1000mg  BID - Diabetic foot exam at last visit; mom brought ophtho records and will scan into chart - F/u in 3 months for next A1c

## 2012-06-02 NOTE — Assessment & Plan Note (Addendum)
BP well controlled on lisinopril 10mg  and metoprolol XL 100mg  daily with normal renal function and K 04/2012 - Get a blood pressure cuff  - Write down pressures and dates and bring it next time

## 2012-06-02 NOTE — Assessment & Plan Note (Signed)
Improved and did not need to see surgeon. Likely increased gas/constipation.

## 2012-06-02 NOTE — Assessment & Plan Note (Addendum)
Lost 9 lbs since 04/2012 visit by doing exercise video with mom and eating healthier foods.  - Congratulated - Goal: Continue doing cardio for seniors 6d /week and dieting with baked and boiled foods with 1 cheat day.  - Aware of Dr. Gerilyn Pilgrim and will let me know if they would like to try this resource in the future, but want to do on own for now.

## 2012-06-25 ENCOUNTER — Encounter: Payer: Self-pay | Admitting: Family Medicine

## 2012-06-29 ENCOUNTER — Other Ambulatory Visit: Payer: Self-pay | Admitting: Family

## 2012-06-29 ENCOUNTER — Other Ambulatory Visit: Payer: Self-pay

## 2012-06-29 ENCOUNTER — Other Ambulatory Visit: Payer: Self-pay | Admitting: Family Medicine

## 2012-06-29 DIAGNOSIS — G40319 Generalized idiopathic epilepsy and epileptic syndromes, intractable, without status epilepticus: Secondary | ICD-10-CM

## 2012-06-29 MED ORDER — CARBATROL 300 MG PO CP12: ORAL_CAPSULE | ORAL | Status: DC

## 2012-06-29 NOTE — Telephone Encounter (Signed)
Mom lvm asking that refill be sent the pharmacy.

## 2012-07-26 ENCOUNTER — Other Ambulatory Visit: Payer: Self-pay

## 2012-07-28 ENCOUNTER — Other Ambulatory Visit: Payer: Self-pay | Admitting: Family Medicine

## 2012-10-03 ENCOUNTER — Encounter: Payer: Self-pay | Admitting: Family

## 2012-10-03 ENCOUNTER — Ambulatory Visit (INDEPENDENT_AMBULATORY_CARE_PROVIDER_SITE_OTHER): Payer: Medicare Other | Admitting: Family

## 2012-10-03 VITALS — BP 130/80 | HR 80 | Ht 63.0 in | Wt 162.8 lb

## 2012-10-03 DIAGNOSIS — G4700 Insomnia, unspecified: Secondary | ICD-10-CM | POA: Insufficient documentation

## 2012-10-03 DIAGNOSIS — Q043 Other reduction deformities of brain: Secondary | ICD-10-CM

## 2012-10-03 DIAGNOSIS — G40909 Epilepsy, unspecified, not intractable, without status epilepticus: Secondary | ICD-10-CM

## 2012-10-03 DIAGNOSIS — E663 Overweight: Secondary | ICD-10-CM

## 2012-10-03 DIAGNOSIS — Q039 Congenital hydrocephalus, unspecified: Secondary | ICD-10-CM

## 2012-10-03 DIAGNOSIS — Z6825 Body mass index (BMI) 25.0-25.9, adult: Secondary | ICD-10-CM

## 2012-10-03 DIAGNOSIS — R269 Unspecified abnormalities of gait and mobility: Secondary | ICD-10-CM | POA: Insufficient documentation

## 2012-10-03 DIAGNOSIS — G479 Sleep disorder, unspecified: Secondary | ICD-10-CM | POA: Insufficient documentation

## 2012-10-03 DIAGNOSIS — I10 Essential (primary) hypertension: Secondary | ICD-10-CM

## 2012-10-03 DIAGNOSIS — G40319 Generalized idiopathic epilepsy and epileptic syndromes, intractable, without status epilepticus: Secondary | ICD-10-CM | POA: Insufficient documentation

## 2012-10-03 MED ORDER — CARBATROL 300 MG PO CP12: ORAL_CAPSULE | ORAL | Status: DC

## 2012-10-03 MED ORDER — TRAZODONE HCL 50 MG PO TABS: ORAL_TABLET | ORAL | Status: DC

## 2012-10-03 NOTE — Patient Instructions (Signed)
Continue [name] without change. Let me know if Katelyn [name] has increased number or severity of her seizures. I have prescribed Trazodone for her insomnia. We will start at low dose and see how she tolerates it. Give her 1 tablet at bedtime. If this does not help after 2 weeks, call and let me know.  Continue to monitor her blood pressures and blood sugars.  Please plan to return for follow up in 1 year but be sure to call and let me know how she is doing with sleep.

## 2012-10-03 NOTE — Progress Notes (Signed)
Patient: Katelyn [name] MRN: [id number] Sex: female DOB: [birth year]  Provider: Elveria Rising, NP Location of Care: [facility]  Note type: Routine return visit  History of Present Illness: Referral Source: [facility] History from: Mother Chief Complaint: Seizure Disorder  Katelyn [name] is a 27 y.o. 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  She has focal motor seizures and intellectual delay.   Today her mother reports that Katelyn [name] has occasional brief seizures, usually associated with her menses. She has had problems with elevated blood pressure but otherwise has been healthy.  Mom is concerned because Katelyn [name] sleeps very little despite Mom's efforts at good sleep hygiene. She has tried Melatonin and Clonazepam but says that neither helped. Katelyn [name] will go to bed on time, but is awake and out of bed at least every hour. Mom says that she usually gets up, urinates and goes back to bed when told to do so. Mom says that she does not urinate frequently during the day and that her blood sugars are in good control. She gets up very early each morning and stays up. She does not sleep during the day, even when she looks and acts very tired. Mom has had her own health problems recently and is exhausted from being awake at night monitoring her activities.   Review of Systems: 12 system review was remarkable for seizure, not enough sleep and sleepiness  Past Medical History  Diagnosis Date  . Hypertension   . Diabetes mellitus without complication   . Seizures   . Congenital hydrocephalus      has VP shunt  . Insomnia   . Agenesis of corpus callosum   . 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  She has focal motor seizures and intellectual delay.  Katelyn [name] also has chronic medical problems of hypertension and diabetes.  Surgical History Past Surgical History  Procedure Laterality Date  . Shunt replacement  2011    Was readmitted 1 week later with peritoneal infection from shunt  . Wisdom tooth extraction      Family History family history includes Cancer in her maternal grandmother and paternal grandfather; Diabetes in her father, maternal grandmother, and maternal uncle; Heart disease in her maternal grandfather; Hypertension in her father and mother; Thyroid disease in her maternal grandmother. Her father is deceased from renal failure. Family History is negative migraines, seizures, cognitive impairment, blindness, deafness, birth defects, chromosomal disorder, autism.  Social History History   Social History  . Marital Status: Single    Spouse Name: N/A    Number of Children: N/A  . Years of Education: N/A   Social History Main Topics  . Smoking status: Never Smoker   . Smokeless tobacco: Never Used  . Alcohol Use: No  . Drug Use: No  . Sexual Activity: No  Other Topics Concern  . None   Social History Narrative   Lives with mother.   Denies pets.   Denies smoke exposure   Denies tobacco, drugs, or alcohol use   Hobbies: Mostly listens to  music, does lots of writing, activity books, goes shopping   Went to [employer] until 27 years old.   Brushes own teeth; mom helps her bathe; fixes own drinks; mom prepares meals - takes care of self and mom helps.   Denies sexual relationship.   Denies concern for STDs.         School comments July graduated from [employer] in 2009.  No Known Allergies  Physical Exam BP 130/80  Pulse 80  Ht 5\' 3"  (1.6 m)  Wt 162 lb 12.8 oz (73.846 kg)  BMI 28.85 kg/m2 General: well developed, well nourished female, seated in exam room, in no evident distress. She is quiet but attentive. Head: She has a misshapen head.  She has oxycephaly, plagiocephaly, and megencephaly.  She has a VP shunt and esotropia.  Neck: supple with no carotid or supraclavicular bruits. Respiratory: lungs clear to auscultation Cardiovascular: regular rate and rhythm, no murmurs  Neurologic Exam  Mental Status: Awake and fully alert.  She has mental retardation. She is able to answer some questions but is unable to volunteer information. Cranial Nerves: Fundoscopic exam reveals sharp disc margins.  Pupils equal, briskly reactive to light.  She has esotropia.  Visual fields appear full to confrontation.  Hearing intact and symmetric to whisper.  Facial sensation intact.  Face, tongue, palate move normally and symmetrically.  Neck flexion and extension normal. Motor: Normal bulk and tone.  Normal strength in all tested extremity muscles. Sensory: Intact to touch and temperature in all extremities. Coordination: Rapid alternating movements slowed in all extremities.  Finger-to-nose and heel-to-shin performed accurately bilaterally.Romberg negative. Gait and Station: Arises from chair without difficulty.  Stance is wide based.  Gait demonstrates normal stride length and balance.  Able to heel and toe walk fairly well. She could not perform a tandem walk without ataxia.  Reflexes: Diminished and symmetric.  Toes  downgoing.   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  She has focal motor seizures and intellectual delay. Her seizures are under fairly good control, with only an occasional brief seizure associated with her menses. She is taking and tolerating [name] for her seizure disorder. Katelyn [name] has ongoing problems with insomnia, despite good sleep hygiene, and trials of Melatonin and Clonazepam. I recommended a trial of low dose Trazodone and instructed mom to call me if it does not work. We may need to increase the dose. I will otherwise see her back in follow up in 1 year or sooner if needed.

## 2012-10-26 ENCOUNTER — Encounter: Payer: Self-pay | Admitting: Family Medicine

## 2012-10-26 ENCOUNTER — Ambulatory Visit (INDEPENDENT_AMBULATORY_CARE_PROVIDER_SITE_OTHER): Payer: Medicare Other | Admitting: Family Medicine

## 2012-10-26 VITALS — BP 128/88 | HR 81 | Temp 98.8°F | Ht 63.0 in | Wt 163.0 lb

## 2012-10-26 DIAGNOSIS — G40909 Epilepsy, unspecified, not intractable, without status epilepticus: Secondary | ICD-10-CM

## 2012-10-26 DIAGNOSIS — Z23 Encounter for immunization: Secondary | ICD-10-CM

## 2012-10-26 DIAGNOSIS — Z0000 Encounter for general adult medical examination without abnormal findings: Secondary | ICD-10-CM

## 2012-10-26 DIAGNOSIS — I10 Essential (primary) hypertension: Secondary | ICD-10-CM

## 2012-10-26 DIAGNOSIS — E119 Type 2 diabetes mellitus without complications: Secondary | ICD-10-CM

## 2012-10-26 LAB — BASIC METABOLIC PANEL
Calcium: 9.3 mg/dL (ref 8.4–10.5)
Potassium: 3.8 mEq/L (ref 3.5–5.3)
Sodium: 138 mEq/L (ref 135–145)

## 2012-10-26 LAB — POCT GLYCOSYLATED HEMOGLOBIN (HGB A1C): Hemoglobin A1C: 5.7

## 2012-10-26 MED ORDER — ONETOUCH ULTRASOFT LANCETS MISC: Status: DC

## 2012-10-26 MED ORDER — LISINOPRIL 10 MG PO TABS: 10.0000 mg | ORAL_TABLET | Freq: Every day | ORAL | Status: DC

## 2012-10-26 NOTE — Progress Notes (Addendum)
Patient ID: Katelyn [name], female   DOB: [birth year], 27 y.o.   MRN: [id number]  Subjective:   CC: Follow up DM and HTN  HPI:   1. HTN: Mom accompanies Katelyn [name] today and provides history. She reports patient has had some very high BPs at home in 170-180s/100s, though mostly 130-140s systolic. Denies symptoms of hyper- or hypotension such as headache, visual changes, dizziness, or fainting. Takes medication daily.  2. DM - Denies symptoms of hyper- or hypoglycemia, taking metformin daily and BGs written down are mostly 100-120s fasting. Does not drink sweet beverages except occasional watered-down soda.  3. Seizure disorder - Recently saw neurologist who notes seizure disorder under fairly good control with occasional brief seizures around time of her menses. Takes carbatrol and tolerating. Mom wonders if we can try to control periods.  Review of Systems - Per HPI.   PMH: Med review: Newly started trazodone nightly for insomnia and failure of melatonin and clonazepam, mom reports not working.   FH: BP and DM maternal and paternal multiple family members  Objective:  Physical Exam BP 128/88  Pulse 81  Temp(Src) 98.8 F (37.1 C) (Oral)  Ht 5\' 3"  (1.6 m)  Wt 163 lb (73.936 kg)  BMI 28.88 kg/m2  LMP 10/15/2012 GEN: NAD, pleasant, seated on exam table PULM: Normal effort HEENT: Oxycephaly, plagiocephaly, megalencephaly, sclera clear, esotropia SKIN: No rash or cyanosis NEURO: Alert, slightly slowed speech but at patient's baseline with mental retardation, able to stand and walk unassisted, seated with no support on exam table ABD: Soft, obese Neck: Supple   Assessment:     Katelyn [name] is a 27 y.o. female with h/o congenital hydrocephalus, seizure disorder, DM and HTN here for follow up of DM and HTN.    Plan:     # See problem list for problem-specific plans. - Flu shot and pneumovac today

## 2012-10-26 NOTE — Patient Instructions (Addendum)
It was good to see you today.  I have refilled test strips, lancets, and lisinopril. Let me know if the pharmacy says the lancets/test strips need to be ordered differently. We are checking labs today.  I will call with results of A1c and metabolic panel.  You are getting a flu shot and pneumonia shot today. Come back when convenient and we can discuss birth control options to control periods.  DASH Diet The DASH diet stands for "Dietary Approaches to Stop Hypertension." It is a healthy eating plan that has been shown to reduce high blood pressure (hypertension) in as little as 14 days, while also possibly providing other significant health benefits. These other health benefits include reducing the risk of breast cancer after menopause and reducing the risk of type 2 diabetes, heart disease, colon cancer, and stroke. Health benefits also include weight loss and slowing kidney failure in patients with chronic kidney disease.  DIET GUIDELINES  Limit salt (sodium). Your diet should contain less than 1500 mg of sodium daily.  Limit refined or processed carbohydrates. Your diet should include mostly whole grains. Desserts and added sugars should be used sparingly.  Include small amounts of heart-healthy fats. These types of fats include nuts, oils, and tub margarine. Limit saturated and trans fats. These fats have been shown to be harmful in the body. CHOOSING FOODS  The following food groups are based on a 2000 calorie diet. See your Registered Dietitian for individual calorie needs. Grains and Grain Products (6 to 8 servings daily)  Eat More Often: Whole-wheat bread, brown rice, whole-grain or wheat pasta, quinoa, popcorn without added fat or salt (air popped).  Eat Less Often: White bread, white pasta, white rice, cornbread. Vegetables (4 to 5 servings daily)  Eat More Often: Fresh, frozen, and canned vegetables. Vegetables may be raw, steamed, roasted, or grilled with a minimal amount of  fat.  Eat Less Often/Avoid: Creamed or fried vegetables. Vegetables in a cheese sauce. Fruit (4 to 5 servings daily)  Eat More Often: All fresh, canned (in natural juice), or frozen fruits. Dried fruits without added sugar. One hundred percent fruit juice ( cup [237 mL] daily).  Eat Less Often: Dried fruits with added sugar. Canned fruit in light or heavy syrup. [employer], Fish, and Poultry (2 servings or less daily. One serving is 3 to 4 oz [85-114 g]).  Eat More Often: Ninety percent or leaner ground beef, tenderloin, sirloin. Round cuts of beef, chicken breast, [place] breast. All fish. Grill, bake, or broil your meat. Nothing should be fried.  Eat Less Often/Avoid: Fatty cuts of meat, [place], or chicken leg, thigh, or wing. Fried cuts of meat or fish. Dairy (2 to 3 servings)  Eat More Often: Low-fat or fat-free milk, low-fat plain or light yogurt, reduced-fat or part-skim cheese.  Eat Less Often/Avoid: Milk (whole, 2%).Whole milk yogurt. Full-fat cheeses. Nuts, Seeds, and Legumes (4 to 5 servings per week)  Eat More Often: All without added salt.  Eat Less Often/Avoid: Salted nuts and seeds, canned beans with added salt. Fats and Sweets (limited)  Eat More Often: Vegetable oils, tub margarines without trans fats, sugar-free gelatin. Mayonnaise and salad dressings.  Eat Less Often/Avoid: Coconut oils, palm oils, butter, stick margarine, cream, half and half, cookies, candy, pie. FOR MORE INFORMATION The Dash Diet Eating Plan: www.dashdiet.org Document Released: 12/23/2010 Document Revised: 03/28/2011 Document Reviewed: 12/23/2010 [facility] Patient Information 2014 [place], [place].

## 2012-10-27 NOTE — Assessment & Plan Note (Signed)
Relatively good control with occasional short seizures around time of periods, possibly related to hormonal changes - Consider birth control per mom request - Given list of options - Asked mom to bring [place] back for appt to discuss

## 2012-10-27 NOTE — Assessment & Plan Note (Addendum)
Home recorded BPs with suboptimal control (systolic 130-140s) and occasional poor control (170s) - Check renal function with BMET **WNL. Increase to lisinopril 20mg  daily and f/u in 2 weeks with BMET** - Printed DASH diet  - Refilled lisinopril; also taking metoprolol - F/u 2 weeks to recheck

## 2012-10-27 NOTE — Assessment & Plan Note (Addendum)
Last A1c 6.7, aiming for better control in young patient.  - F/u A1c today **5.7** - Refilled lancet and strips - Eye exam and foot exam done this year 04/2012 - Continue metformin 1000mg  BID - Consider adding glipizide depending on A1c **Not at this time** - Per mom, doing best with diet and exercise. **Congratulate on A1c control, mom states sweet beverages are minimum-none**

## 2012-11-02 ENCOUNTER — Encounter: Payer: Self-pay | Admitting: Family Medicine

## 2012-11-02 NOTE — Progress Notes (Signed)
Patient ID: Katelyn [name], female   DOB: 1985-01-25, 27 y.o.   MRN: [id number] Filling out form for [employer]are department providing information about patient testing supplies and my signature stating she tests 1x/day.  Leona Singleton, MD 11/02/2012 1:11 PM

## 2012-11-15 NOTE — Progress Notes (Signed)
Patient ID: Katelyn [name], female   DOB: May 27, 1985, 27 y.o.   MRN: [id number] Filling out same form for [facility] "Physician Order - Diabetic Form" from [facility] DEPARTMENT a second time.  Leona Singleton, MD 11/15/2012 6:58 PM

## 2012-11-20 ENCOUNTER — Ambulatory Visit: Payer: Medicare Other | Admitting: Family Medicine

## 2012-12-12 ENCOUNTER — Ambulatory Visit: Payer: Medicare Other | Admitting: Family Medicine

## 2012-12-24 ENCOUNTER — Ambulatory Visit: Payer: Medicare Other | Admitting: Family Medicine

## 2013-01-26 ENCOUNTER — Other Ambulatory Visit: Payer: Self-pay | Admitting: Family Medicine

## 2013-03-08 ENCOUNTER — Ambulatory Visit: Payer: Medicare Other | Admitting: Family Medicine

## 2013-03-14 ENCOUNTER — Other Ambulatory Visit: Payer: Self-pay | Admitting: Family Medicine

## 2013-03-20 ENCOUNTER — Other Ambulatory Visit: Payer: Self-pay | Admitting: Family Medicine

## 2013-03-22 ENCOUNTER — Ambulatory Visit: Payer: Medicare Other | Admitting: Family Medicine

## 2013-03-25 ENCOUNTER — Telehealth: Payer: Self-pay | Admitting: Family Medicine

## 2013-03-25 MED ORDER — METOPROLOL SUCCINATE ER 100 MG PO TB24: 100.0000 mg | ORAL_TABLET | Freq: Every day | ORAL | Status: DC

## 2013-03-25 NOTE — Telephone Encounter (Signed)
Mother called and needs a refill on her daughters Metoprolol called in. jw

## 2013-03-25 NOTE — Telephone Encounter (Signed)
Will fill for 1 month although it seems metoprolol should be filled until mid-April. Thank you for also facilitating scheduling follow-up.  Katelyn SingletonMaria T Emogene Muratalla, MD

## 2013-03-25 NOTE — Telephone Encounter (Signed)
Pt has not been seen since October.  Called mom and appt scheduled for this Wednesday (03/27/13).  Katelyn [name], Katelyn RochesterJessica [name]

## 2013-03-25 NOTE — Addendum Note (Signed)
Addended by: Simone CuriaHEKKEKANDAM, Kyzen Horn T on: 03/25/2013 03:09 PM   Modules accepted: Orders

## 2013-03-27 ENCOUNTER — Encounter: Payer: Self-pay | Admitting: Family Medicine

## 2013-03-27 ENCOUNTER — Ambulatory Visit (INDEPENDENT_AMBULATORY_CARE_PROVIDER_SITE_OTHER): Payer: Medicare Other | Admitting: Family Medicine

## 2013-03-27 VITALS — BP 126/80 | HR 116 | Temp 98.5°F | Wt 157.0 lb

## 2013-03-27 DIAGNOSIS — I10 Essential (primary) hypertension: Secondary | ICD-10-CM

## 2013-03-27 DIAGNOSIS — E663 Overweight: Secondary | ICD-10-CM

## 2013-03-27 DIAGNOSIS — G40319 Generalized idiopathic epilepsy and epileptic syndromes, intractable, without status epilepticus: Secondary | ICD-10-CM

## 2013-03-27 DIAGNOSIS — E119 Type 2 diabetes mellitus without complications: Secondary | ICD-10-CM

## 2013-03-27 LAB — BASIC METABOLIC PANEL
BUN: 10 mg/dL (ref 6–23)
CHLORIDE: 106 meq/L (ref 96–112)
CO2: 22 mEq/L (ref 19–32)
Calcium: 9.4 mg/dL (ref 8.4–10.5)
Creat: 0.59 mg/dL (ref 0.50–1.10)
Glucose, Bld: 106 mg/dL — ABNORMAL HIGH (ref 70–99)
Potassium: 4.2 mEq/L (ref 3.5–5.3)
Sodium: 138 mEq/L (ref 135–145)

## 2013-03-27 LAB — POCT GLYCOSYLATED HEMOGLOBIN (HGB A1C): HEMOGLOBIN A1C: 5.6

## 2013-03-27 LAB — LDL CHOLESTEROL, DIRECT: Direct LDL: 119 mg/dL — ABNORMAL HIGH

## 2013-03-27 MED ORDER — LISINOPRIL 20 MG PO TABS: 20.0000 mg | ORAL_TABLET | Freq: Every day | ORAL | Status: DC

## 2013-03-27 MED ORDER — LISINOPRIL 10 MG PO TABS: 10.0000 mg | ORAL_TABLET | Freq: Every day | ORAL | Status: DC

## 2013-03-27 MED ORDER — CARBATROL 300 MG PO CP12: ORAL_CAPSULE | ORAL | Status: DC

## 2013-03-27 MED ORDER — METOPROLOL SUCCINATE ER 100 MG PO TB24: 100.0000 mg | ORAL_TABLET | Freq: Every day | ORAL | Status: DC

## 2013-03-27 NOTE — Patient Instructions (Signed)
It was great to see you today.  I am going to refill your medications for 6 months. Based on your blood pressure, continue current medication and eating healthy and we will plan to see each other again in 6 months.  We are going to check some labs today and I will call you if there are any abnormalities. Call for an eye appointment.  Have a great trip!  Katelyn [name] T Katelyn Ault, MD    DASH Diet The DASH diet stands for "Dietary Approaches to Stop Hypertension." It is a healthy eating plan that has been shown to reduce high blood pressure (hypertension) in as little as 14 days, while also possibly providing other significant health benefits. These other health benefits include reducing the risk of breast cancer after menopause and reducing the risk of type 2 diabetes, heart disease, colon cancer, and stroke. Health benefits also include weight loss and slowing kidney failure in patients with chronic kidney disease.  DIET GUIDELINES  Limit salt (sodium). Your diet should contain less than 1500 mg of sodium daily.  Limit refined or processed carbohydrates. Your diet should include mostly whole grains. Desserts and added sugars should be used sparingly.  Include small amounts of heart-healthy fats. These types of fats include nuts, oils, and tub margarine. Limit saturated and trans fats. These fats have been shown to be harmful in the body. CHOOSING FOODS  The following food groups are based on a 2000 calorie diet. See your Registered Dietitian for individual calorie needs. Grains and Grain Products (6 to 8 servings daily)  Eat More Often: Whole-wheat bread, brown rice, whole-grain or wheat pasta, quinoa, popcorn without added fat or salt (air popped).  Eat Less Often: White bread, white pasta, white rice, cornbread. Vegetables (4 to 5 servings daily)  Eat More Often: Fresh, frozen, and canned vegetables. Vegetables may be raw, steamed, roasted, or grilled with a minimal amount of fat.  Eat  Less Often/Avoid: Creamed or fried vegetables. Vegetables in a cheese sauce. Fruit (4 to 5 servings daily)  Eat More Often: All fresh, canned (in natural juice), or frozen fruits. Dried fruits without added sugar. One hundred percent fruit juice ( cup [237 mL] daily).  Eat Less Often: Dried fruits with added sugar. Canned fruit in light or heavy syrup. [employer]ockerLean Meats, Fish, and Poultry (2 servings or less daily. One serving is 3 to 4 oz [85-114 g]).  Eat More Often: Ninety percent or leaner ground beef, tenderloin, sirloin. Round cuts of beef, chicken breast, [place] breast. All fish. Grill, bake, or broil your meat. Nothing should be fried.  Eat Less Often/Avoid: Fatty cuts of meat, [place], or chicken leg, thigh, or wing. Fried cuts of meat or fish. Dairy (2 to 3 servings)  Eat More Often: Low-fat or fat-free milk, low-fat plain or light yogurt, reduced-fat or part-skim cheese.  Eat Less Often/Avoid: Milk (whole, 2%).Whole milk yogurt. Full-fat cheeses. Nuts, Seeds, and Legumes (4 to 5 servings per week)  Eat More Often: All without added salt.  Eat Less Often/Avoid: Salted nuts and seeds, canned beans with added salt. Fats and Sweets (limited)  Eat More Often: Vegetable oils, tub margarines without trans fats, sugar-free gelatin. Mayonnaise and salad dressings.  Eat Less Often/Avoid: Coconut oils, palm oils, butter, stick margarine, cream, half and half, cookies, candy, pie. FOR MORE INFORMATION The Dash Diet Eating Plan: www.dashdiet.org Document Released: 12/23/2010 Document Revised: 03/28/2011 Document Reviewed: 12/23/2010 [facility] Patient Information 2014 [place].

## 2013-03-27 NOTE — Assessment & Plan Note (Addendum)
Changed from diabetes, given controlled A1c 5.7 last visit and 5.6 today. - Continue metformin. Refilling today for 6 months. - Continue weight management. Congratulated on efforts. - Direct LDL today>>>119. Will mail letter with options of continued diet/exercise vs starting low-dose statin and f/u at next visit. - Foot exam WNL today. Call ophthalmologist for eye exam due 04/2013.

## 2013-03-27 NOTE — Progress Notes (Signed)
Patient ID: Katelyn FusiJeelisha [name], female   DOB: 20-Oct-1985, 28 y.o.   MRN: [id number] Subjective:   CC: F/u diabetes and HTN  HPI:   History provided by mom and patient.  Hypertension - Patient takes lisinopril 20mg  (increased at last visit in Oct) and metoprolol XL 100mg  daily and needs refills. She denies symptoms of low or high BP (dizziness, syncope, headache, blurred vision, chest pain, dyspnea). Denies medication side effects. Took medication this morning.  Pre-diabetes - Patient takes metformin 1000mg  BID and does not miss doses. Denies medication side effects. Denies signs/symptoms of hyper- or hypoglycemia, including blurred vision, increased urinary frequency or thirst, anxiety, diaphoresis, chest pain, or other concerns. Checks fasting blood sugar daily which ranges from 100-120s, never below 100 and never in 200s. She was last here 10/10 and had A1c 5.7. She is due for eye exam in April and foot exam now. Her mother helps be sure she gets minimal sweets (reports 2 sweet drinks per week).   H/o abdominal distension - Resolved since last visit.  Review of Systems - Per HPI.   PMH: Meds: Reviewed  SH: [place] to visit [place]. No tobacco use    Objective:  Physical Exam BP 126/80  Pulse 116  Temp(Src) 98.5 F (36.9 C) (Oral)  Wt 157 lb (71.215 kg) GEN: NAD, very pleasant, "I'm having a good day." HEENT: Atraumatic, head with plagiocephaly specifically increased size vertically with enlarged forehead, neck supple, EOMI, sclera clear  CV: RRR, no murmurs, rubs, or gallops PULM: CTAB, normal effort ABD: Soft, nontender, nondistended, no organomegaly SKIN: No rash or cyanosis; warm and well-perfused EXTR: No lower extremity edema or calf tenderness FOOT exam: No deformity or skin breaks, DP pulse 2+ bilaterally, normal sensation to microfilament bilaterally PSYCH: Mood and affect euthymic, normal rate and volume of speech NEURO: Awake, alert,  no focal deficits grossly, normal speech and gait    Assessment:     Katelyn FusiJeelisha Vantuyl is a 28 y.o. female with h/o HTN and pre-diabetes here for follow-up.    Plan:     # See problem list and after visit summary for problem-specific plans.   # Health Maintenance: Eye exam within the month; foot exam today.  Follow-up: Follow up in 6 months for f/u of pre-DM and HTN.    Katelyn SingletonMaria T Kalayna Noy, MD [facility]

## 2013-03-27 NOTE — Assessment & Plan Note (Addendum)
Controlled - initially 145/94, 126/80 on recheck.  - Continue lisinopril 20mg  daily and metoprolol XL 100 mg daily. Refill 6 months. - Checking BMET today>>>WNL - Continue healthy diet.

## 2013-03-28 ENCOUNTER — Encounter: Payer: Self-pay | Admitting: Family Medicine

## 2013-04-11 ENCOUNTER — Ambulatory Visit: Payer: Medicare Other | Admitting: Family Medicine

## 2013-06-13 ENCOUNTER — Other Ambulatory Visit: Payer: Self-pay | Admitting: Family

## 2013-06-23 ENCOUNTER — Other Ambulatory Visit: Payer: Self-pay | Admitting: Family Medicine

## 2013-07-30 ENCOUNTER — Telehealth: Payer: Self-pay | Admitting: Family Medicine

## 2013-07-30 NOTE — Telephone Encounter (Signed)
Kately[facility]atonja [facility] and wanted to know if the doctor had received the paperwork for home care for Katelyn [name]. If not or if you have please call Katelyn [name] at [phone]. jw

## 2013-07-31 NOTE — Telephone Encounter (Signed)
Thank you Tamika.  Allyna Pittsley T Jalin Erpelding, MD  

## 2013-07-31 NOTE — Telephone Encounter (Signed)
Assessment/ Order form faxed yesterday 07/30/2013 and again today 07/31/2013.  Form copied for scantning in pt's record.  Clovis PuMartin, Tamika L, RN

## 2013-08-07 ENCOUNTER — Ambulatory Visit (INDEPENDENT_AMBULATORY_CARE_PROVIDER_SITE_OTHER): Payer: Medicare Other | Admitting: Family Medicine

## 2013-08-07 VITALS — BP 138/110 | HR 79 | Temp 98.6°F | Resp 16 | Wt 158.0 lb

## 2013-08-07 DIAGNOSIS — R7303 Prediabetes: Secondary | ICD-10-CM

## 2013-08-07 DIAGNOSIS — I10 Essential (primary) hypertension: Secondary | ICD-10-CM

## 2013-08-07 DIAGNOSIS — R7309 Other abnormal glucose: Secondary | ICD-10-CM

## 2013-08-07 LAB — POCT GLYCOSYLATED HEMOGLOBIN (HGB A1C): Hemoglobin A1C: 5.8

## 2013-08-07 NOTE — Patient Instructions (Signed)
Check BPs and call me with 2 values. We are refaxing the form. I will call you with A1c and recommendations.  Best,  Leona SingletonMaria T Roi Jafari, MD

## 2013-08-07 NOTE — Progress Notes (Signed)
Patient ID: Katelyn [name], female   DOB: [birth year], 28 y.o.   MRN: [id number] Subjective:   CC: Forms, f/u BP  HPI:   Forms  Katelyn [name] states the application requesting home care services was declined because Katelyn [name] had not had an exam in some time. That is why she has brought her today.   F/u BP Mom reports she is giving Katelyn [name] meds regularly, with no side effects. No symptoms of high or low blood pressure with no blurred vision, chest pain, dyspnea, or leg swelling and no dizziness or syncope. Home BPs have been 120s/80s. No uncontrolled pain or recent illness. Peeing like normal. Eating healthier, cutting down on sweets and sodas. Walk or goes to pool, tries to go daily.  Diabetes Mellitus A1c being checked today but will fully eval at f/u.  Review of Systems - Per HPI.   SH:  Recently got back from [place], had a good time. Smoking status: No smoke exposure    Objective:  Physical Exam BP 144/108  Pulse 79  Temp(Src) 98.6 F (37 C) (Oral)  Resp 16  Wt 158 lb (71.668 kg)  SpO2 100%  LMP 08/06/2013 GEN: NAD CV: RRR, no m/r/g, 2+ B DP and radial pulses PULM: CTAB, normal effort ABD: soft, nontender    Assessment:     Katelyn [name] is a 28 y.o. female with h/o HTN, pre-diabetes, seizure disorder, congenital hydrocephalus here for form for home care and f/u HTN.    Plan:     Follow up HTN Elevated in clinic today, though reportedly normal at home with regular medication use and no chest pain or dyspnea. Recheck BP: 138/110, likely white coat HTN. - Continue metoprolol-XL and lisinopril - Asked mom to check 3 values at home this week and call. - Continue working on diet and exercise.  Follow up pre-diabetes A1c today: 5.8 from 5.7 last check. - [name]  Follow-up: Follow up within next month to discuss DM. - Form for home care filled out and will be faxed by our office.  Katelyn SingletonMaria T [name], MD [facility]

## 2013-08-10 NOTE — Assessment & Plan Note (Signed)
Elevated in clinic today, though reportedly normal at home with regular medication use and no chest pain or dyspnea. Recheck BP: 138/110, likely white coat HTN. - Continue metoprolol-XL and lisinopril - Asked mom to check 3 values at home this week and call. - Continue working on diet and exercise.

## 2013-08-10 NOTE — Assessment & Plan Note (Signed)
A1c today: 5.8 from 5.7 last check. - [facility]

## 2013-08-12 ENCOUNTER — Telehealth: Payer: Self-pay | Admitting: Family Medicine

## 2013-08-12 NOTE — Telephone Encounter (Signed)
Mother called because the forms that were faxed last week to [facility] were not received and can we re-fax them again. jw

## 2013-08-13 NOTE — Telephone Encounter (Signed)
Yes, will place copy in fax bin to be faxed today again.  Katelyn SingletonMaria T Youcef Klas, MD

## 2013-08-13 NOTE — Telephone Encounter (Signed)
Please let mom know I have faxed it again just now, twice, to both numbers listed on top of the form. I will keep a copy handy for 1 week in case they still have not received the form. Ask her to please let us know if they still do not receive it later today or tomorrow morning.  Katelyn SingletonMaria T Hildreth Orsak, MD

## 2013-08-14 NOTE — Telephone Encounter (Signed)
Pt mom informed. Katelyn [name] Dawn  

## 2013-10-03 ENCOUNTER — Encounter: Payer: Self-pay | Admitting: Family

## 2013-10-03 ENCOUNTER — Ambulatory Visit (INDEPENDENT_AMBULATORY_CARE_PROVIDER_SITE_OTHER): Payer: Medicare Other | Admitting: Family

## 2013-10-03 VITALS — BP 124/76 | HR 78 | Ht 63.0 in | Wt 152.6 lb

## 2013-10-03 DIAGNOSIS — Q039 Congenital hydrocephalus, unspecified: Secondary | ICD-10-CM

## 2013-10-03 DIAGNOSIS — Q043 Other reduction deformities of brain: Secondary | ICD-10-CM

## 2013-10-03 DIAGNOSIS — G4700 Insomnia, unspecified: Secondary | ICD-10-CM

## 2013-10-03 DIAGNOSIS — G4720 Circadian rhythm sleep disorder, unspecified type: Secondary | ICD-10-CM | POA: Insufficient documentation

## 2013-10-03 DIAGNOSIS — G479 Sleep disorder, unspecified: Secondary | ICD-10-CM

## 2013-10-03 DIAGNOSIS — G40319 Generalized idiopathic epilepsy and epileptic syndromes, intractable, without status epilepticus: Secondary | ICD-10-CM

## 2013-10-03 DIAGNOSIS — R269 Unspecified abnormalities of gait and mobility: Secondary | ICD-10-CM

## 2013-10-03 NOTE — Patient Instructions (Addendum)
Because Katelyn [name] has had ongoing problems with insomnia and waking from sleep, and medications we have tried have not helped with this, I will refer her to a sleep specialist for evaluation.   Continue her current medications without change for now.   Continue working on diet and exercise. She is doing great with her weight loss! I have printed her last 3 weights for you to see.  Wt Readings from Last 3 Encounters:  10/03/13 152 lb 9.6 oz (69.219 kg)  08/07/13 158 lb (71.668 kg)  03/27/13 157 lb (71.215 kg)   I will see her back in follow up in 1 year or sooner if needed. After she has been seen by the sleep specialist, Dr. Sharene Skeans or I will call you and talk with you about the results and recommendations. That may mean that she will need to be seen sooner than one year or she may need to follow up with the sleep specialist, depending on what the results show.   Call me if you have any questions or concerns.

## 2013-10-03 NOTE — Progress Notes (Signed)
Patient: Katelyn [name] MRN: [id number] Sex: female DOB: 1985-12-22  Provider: Elveria Rising, NP Location of Care: [facility]  Note type: Routine return visit  History of Present Illness: Referral Source: [facility] History from: her mother Chief Complaint: Seizure Disorder  Katelyn [name] is a 28 y.o. 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 She has focal motor seizures and intellectual delay. Jamesina was last seen October 03, 2012.   Today her mother reports that [name] continues to have occasional brief seizures, usually associated with her menses. She has been helping her loose weight because of problems with elevated blood pressure and blood sugars. [name] has had ongoing problems with sleep arousals. Mom says that she sleeps very little despite all efforts to help her to go to bed and stay asleep. They have been walking for exercise and Mom said that she can tell no difference. [name] was tried on Clonidine when she was younger, and then Melatonin and Clonazepam when she was older. None of these agents helped. Mom said that she has tried OTC sleep medications with no benefit. More recently she has been tried on Trazodone with no change in her sleep habits. Mom said that she will go to bed on time, but is awake and out of bed at least every hour. Mom says that she usually gets up, urinates and goes back to bed when told to do so. Mom says that she does not urinate frequently during the day and that her blood sugars are in good control. Sometimes Mom finds her in  the bathroom with no need to urinate, but simply in the bathroom. It is not clear why she is there. Mom has not seen behaviors that suggest sleepwalking, or seizure behaviors, other than at times when she is simply standing in the bathroom. Mom sends her back to bed, and about an hour later, she is up again. Chistina then gets up very early each morning and stays up. She does not sleep during the day, even when she looks and acts very tired. Mom is fatigued from waking all night and monitoring [name]'s activities.  Review of Systems: 12 system review was unremarkable  Past Medical History  Diagnosis Date  . Hypertension   . Diabetes mellitus without complication   . Seizures   . Congenital hydrocephalus     has VP shunt  . Insomnia   . Agenesis of corpus callosum   . Intellectual delay    Hospitalizations: No., Head Injury: No., Nervous System Infections: No., Immunizations up to date: Yes.   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 She has focal motor seizures and intellectual delay. [name] also has chronic medical problems of hypertension and diabetes.   Surgical History Past Surgical History  Procedure Laterality Date  . Shunt replacement  2011    Was readmitted 1 week later with peritoneal infection from shunt  . Wisdom tooth extraction  Family History family history includes Cancer in her maternal grandmother and paternal grandfather; Diabetes in her father, maternal grandmother, and maternal uncle; Heart disease in her maternal grandfather; Hypertension in her father and mother; Thyroid  disease in her maternal grandmother. Family History is otherwise negative for migraines, seizures, cognitive impairment, blindness, deafness, birth defects, chromosomal disorder, autism.  Social History History   Social History  . Marital Status: Single    Spouse Name: N/A    Number of Children: N/A  . Years of Education: N/A   Social History Main Topics  . Smoking status: Never Smoker   . Smokeless tobacco: Never Used  . Alcohol Use: No  . Drug Use: No  . Sexual Activity: No   Other Topics Concern  . None   Social History Narrative   Lives with mother.   Denies pets.   Denies smoke exposure   Denies tobacco, drugs, or alcohol use   Hobbies: Mostly listens to music, does lots of writing, activity books, goes shopping   Went to [employer] until 28 years old.   Brushes own teeth; mom helps her bathe; fixes own drinks; mom prepares meals - takes care of self and mom helps.   Denies sexual relationship.   Denies concern for STDs.            Educational level: 12th grade School Attending:N/A Living with:  mother  Hobbies/Interest: likes music, movies and walking School comments: [name] is being cared for at home.  Physical Exam BP 124/76  Pulse 78  Ht  (1.6 m)  Wt 152 lb 9.6 oz (69.219 kg)  BMI 27.04 kg/m2  LMP 09/19/2013 General: well developed, well nourished female, seated in exam room, in no evident distress. She is quiet but attentive.  Head: She has a misshapen head. She has oxycephaly, plagiocephaly, and megencephaly. She has a VP shunt and esotropia.  Neck: supple with no carotid or supraclavicular bruits.  Respiratory: lungs clear to auscultation  Cardiovascular: regular rate and rhythm, no murmurs   Neurologic Exam  Mental Status: Awake and fully alert. She has mental retardation. She is able to answer some questions but is unable to volunteer information.  Cranial Nerves: Fundoscopic exam reveals sharp disc margins. Pupils equal, briskly  reactive to light. She has esotropia. Visual fields appear full to confrontation. Hearing intact and symmetric to whisper. Facial sensation intact. Face, tongue, palate move normally and symmetrically. Neck flexion and extension normal.  Motor: Normal bulk and tone. Normal strength in all tested extremity muscles.  Sensory: Intact to touch and temperature in all extremities.  Coordination: Rapid alternating movements slowed in all extremities. Finger-to-nose and heel-to-shin performed accurately bilaterally.Romberg negative.  Gait and Station: Arises from chair without difficulty. Stance is wide based. Gait demonstrates normal stride length and balance. Able to heel and toe walk fairly well. She could not perform a tandem walk without ataxia.  Reflexes: Diminished and symmetric. Toes downgoing.   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 She has focal motor seizures and intellectual delay. Her seizures are under fairly good control, with only an occasional brief seizure associated with her menses. She is taking and tolerating [name] for her seizure disorder,  and I will not make changes in her epilepsy treatment at this time. Tala has ongoing problems with insomnia and sleep arousals, despite good sleep hygiene. She has been tried on Clonidine, Melatonin, Clonazepam, OTC medications and currently Trazodone. I am concerned about her lack of sleep and recommended that we refer her for an evaluation by a sleep specialist. While it is not evident that she is having any seizure behaviors in her  sleep that awaken her, it would be beneficial to evaluate this as well as part of the sleep study. I will contact her mother when I receive the sleep study results. Otherwise, we will plan to see Yomaris back in follow up in 1 year or sooner if needed.

## 2013-10-07 ENCOUNTER — Institutional Professional Consult (permissible substitution): Payer: Medicare Other | Admitting: Neurology

## 2013-10-12 ENCOUNTER — Other Ambulatory Visit: Payer: Self-pay | Admitting: Family Medicine

## 2013-11-01 ENCOUNTER — Institutional Professional Consult (permissible substitution): Payer: Medicare Other | Admitting: Neurology

## 2013-11-13 ENCOUNTER — Other Ambulatory Visit: Payer: Self-pay | Admitting: Family Medicine

## 2013-12-04 ENCOUNTER — Ambulatory Visit (INDEPENDENT_AMBULATORY_CARE_PROVIDER_SITE_OTHER): Payer: Medicare Other | Admitting: *Deleted

## 2013-12-04 ENCOUNTER — Encounter: Payer: Self-pay | Admitting: Family Medicine

## 2013-12-04 ENCOUNTER — Ambulatory Visit (INDEPENDENT_AMBULATORY_CARE_PROVIDER_SITE_OTHER): Payer: Medicare Other | Admitting: Family Medicine

## 2013-12-04 VITALS — BP 138/94 | HR 108 | Temp 98.6°F | Ht 63.0 in | Wt 154.0 lb

## 2013-12-04 DIAGNOSIS — I10 Essential (primary) hypertension: Secondary | ICD-10-CM

## 2013-12-04 DIAGNOSIS — R7309 Other abnormal glucose: Secondary | ICD-10-CM

## 2013-12-04 DIAGNOSIS — G40909 Epilepsy, unspecified, not intractable, without status epilepticus: Secondary | ICD-10-CM

## 2013-12-04 DIAGNOSIS — Z23 Encounter for immunization: Secondary | ICD-10-CM

## 2013-12-04 DIAGNOSIS — R7303 Prediabetes: Secondary | ICD-10-CM

## 2013-12-04 DIAGNOSIS — S99922A Unspecified injury of left foot, initial encounter: Secondary | ICD-10-CM

## 2013-12-04 LAB — POCT GLYCOSYLATED HEMOGLOBIN (HGB A1C): Hemoglobin A1C: 5.7

## 2013-12-04 MED ORDER — CARBATROL 300 MG PO CP12: ORAL_CAPSULE | ORAL | Status: DC

## 2013-12-04 NOTE — Progress Notes (Addendum)
Patient ID: Paulina FusiJeelisha Brunty, female   DOB: Sep 25, 1985, 28 y.o.   MRN: [id number] Subjective:   CC: F/u preDM and HTN  HPI:   Seen July for prediabetes and HTN  HTN She has taken metoprolol XL and lisinopril regularly. BPs checked at home, highest value 145 systolic x 1, usually 120-125/80s. Most recent creatinine normal. Diet: Cut out sodas, watching carbs, she does not like breads or potatoes. Exercise: Walking 15 minutes 3-4 times weekly. Denies chest pain, dyspnea, leg swelling, blurred vision, fainting.  Prediabetes  A1c 5.8 last visit from 5.6. 5.7 today. Abdominal fat is hard to get rid of per mom.  Taking metformin regularly. Avoids sodas. Dilutes other sweet beverages. Walking about 15 minutes at a time, 3-4 times weekly. Denies chest pain, dyspnea, leg swelling, blurred vision, or fainting.  Seizure disorder Seen 9/17 by pediatric neurology and seizures under good control with occasional brief seizure associated with menses. Tolerating carbatrol and no changes were made. Ongoing problems with insomnia and lseep arousal despite hygiene. Tried multiple meds and on trazodone currently. Planned sleep evaluation. Seizure last week, hit big toe on left fot, knocked top of nail off.  Had seizure 1 week ago and hit left great toe. Nail broke in half. Have been using antibiotic ointment and keeping bandaid on at night and part of day. No fevers/chills or pain or exudate.  Review of Systems - Per HPI. Additionally, would like another appt to discuss starting OCP. No h/o dizziness/fainting/chest pain or FH of sudden death/cardiac death in child.  PMH- HTN, prediabetes, seizures, congenital hydrocephalus with VP shunt, insomnia, agenesis of corpus callosum, and intellectual delay.  Smoking status: Nonsmoker    Objective:  Physical Exam BP 138/94 mmHg  Pulse 108  Temp(Src) 98.6 F (37 C) (Oral)  Ht 5\' 3"  (1.6 m)  Wt 154 lb (69.854 kg)  BMI 27.29 kg/m2  LMP 11/11/2013  (Approximate) GEN: NAD CV: RRR, possible subtle II/VI systolic murmur, 2+ B radial and DP pulses PULM: CTAB, normal effort HEENT: Sclera clear, PERRL, EOMI, enlarged cranium EXTR: No LE edema or calf tenderness Left great toe with 1/4 of toe missing, nailbed is erythematous with very small amount of dried mucopurulent discharge, no tenderness; skin around nailbed is hypopigmented and macerated appearing, but not erythematous or tender    Assessment:     Paulina FusiJeelisha Hartin is a 28 y.o. female here for f/u HTN and prediabetes.    Plan:     # See problem list and after visit summary for problem-specific plans.   # Health Maintenance: Flu shot today. - Would like to discuss birth control options at follow up. List of options given. - Listen again for possible subtle murmur.  Follow-up: Follow up in 4 months for f/u HTN.   Leona SingletonMaria T Norlan Rann, MD [facility]

## 2013-12-04 NOTE — Assessment & Plan Note (Signed)
Mildly high today but home values are controlled 120s/80s. No symptoms of end-organ damage. Cr in March normal. - BMET this coming March. - Continue metoprolol XL and lisinopril. - Work on weight loss, challenging in pt with intellectual delay. Continue regular exercise, try to add one more day weekly. Control intake of carbs. - F/u in 3-6 months.

## 2013-12-04 NOTE — Assessment & Plan Note (Signed)
Seizure disorder stable, occasional seizures, had one 1 week ago. Needs refill on carbatrol. - Refilled today x 1 month, asked to get further refills from neurologist. - Left great toe injury per A/P for that problem.

## 2013-12-04 NOTE — Assessment & Plan Note (Addendum)
A1c 5.7, stable last 3 checks.  - Continue to moderate carbohydrates. DM diet handout provided. - Increase exercise by one more day weekly. - Recheck A1c in 6 months. - Recheck lipids at that time as well. Last check in March, had discussed diet/exercise vs considering low-dose statin and mom was undecided.

## 2013-12-04 NOTE — Assessment & Plan Note (Signed)
Trauma with stubbed toe during seizure 1 week ago. Missing 1/4 of distal toenail. Healing well per mom. No further bruising but some erythema of exposed nailbed and minimal exudate. No tenderness. Skin of great toe appears macerated.  - Start to leave uncovered more time (only put bandaid overnight) to let dry. - S/p 1 week of antibiotic ointment. Stop this to prevent irritation/secondary infection. - Return if not continuing to improve or if signs of infection including erythema, exudate, or other cocnerns.

## 2013-12-04 NOTE — Patient Instructions (Signed)
Great to see you!  Your BP looks a little high today but your home values have been very good. Continue medications as listed.  Your A1c is 5.7. You can wait 6 months till the next refill. Continue to watch what you eat especially with carbohydrates. See guidelines below.  You are getting a flu shot today.  Come back when convenient and we will go over birth control options.  Come back in March for BP follow up.  I refilled your carbatrol for 1 month. The neurologist usually prescribes this so ask your pharmacy to send them the next refill request Elveria Rising(Tina Goodpasture NP).  Diet Recommendations for Diabetes   Starchy (carb) foods include: Bread, rice, pasta, potatoes, corn, crackers, bagels, muffins, all baked goods.  (Fruits, milk, and yogurt also have carbohydrate, but most of these foods will not spike your blood sugar as the starchy foods will.)  A few fruits do cause high blood sugars; use small portions of bananas (limit to 1/2 at a time), grapes, and most tropical fruits.    Protein foods include: Meat, fish, poultry, eggs, dairy foods, and beans such as pinto and kidney beans (beans also provide carbohydrate).   1. Eat at least 3 meals and 1-2 snacks per day. Never go more than 4-5 hours while awake without eating.  2. Limit starchy foods to TWO per meal and ONE per snack. ONE portion of a starchy  food is equal to the following:   - ONE slice of bread (or its equivalent, such as half of a hamburger bun).   - 1/2 cup of a "scoopable" starchy food such as potatoes or rice.   - 15 grams of carbohydrate as shown on food label.  3. Both lunch and dinner should include a protein food, a carb food, and vegetables.   - Obtain twice as many veg's as protein or carbohydrate foods for both lunch and dinner.   - Fresh or frozen veg's are best.   - Try to keep frozen veg's on hand for a quick vegetable serving.    4. Breakfast should always include protein.

## 2013-12-24 ENCOUNTER — Ambulatory Visit: Payer: Medicare Other | Admitting: Family Medicine

## 2014-01-06 ENCOUNTER — Other Ambulatory Visit: Payer: Self-pay | Admitting: Family

## 2014-01-07 ENCOUNTER — Ambulatory Visit: Payer: Medicare Other | Admitting: Family Medicine

## 2014-01-11 ENCOUNTER — Encounter (HOSPITAL_COMMUNITY): Payer: Self-pay | Admitting: Emergency Medicine

## 2014-01-11 ENCOUNTER — Emergency Department (HOSPITAL_COMMUNITY): Payer: Medicare Other

## 2014-01-11 ENCOUNTER — Emergency Department (HOSPITAL_COMMUNITY)
Admission: EM | Admit: 2014-01-11 | Discharge: 2014-01-11 | Disposition: A | Discharge status: Home / Self Care | Payer: Medicare Other | Attending: Emergency Medicine | Admitting: Emergency Medicine

## 2014-01-11 DIAGNOSIS — R569 Unspecified convulsions: Secondary | ICD-10-CM

## 2014-01-11 DIAGNOSIS — E119 Type 2 diabetes mellitus without complications: Secondary | ICD-10-CM | POA: Diagnosis not present

## 2014-01-11 DIAGNOSIS — I10 Essential (primary) hypertension: Secondary | ICD-10-CM | POA: Diagnosis not present

## 2014-01-11 DIAGNOSIS — Q040 Congenital malformations of corpus callosum: Secondary | ICD-10-CM | POA: Insufficient documentation

## 2014-01-11 DIAGNOSIS — Z87728 Personal history of other specified (corrected) congenital malformations of nervous system and sense organs: Secondary | ICD-10-CM | POA: Insufficient documentation

## 2014-01-11 DIAGNOSIS — R000 Tachycardia, unspecified: Secondary | ICD-10-CM | POA: Diagnosis not present

## 2014-01-11 DIAGNOSIS — Z79899 Other long term (current) drug therapy: Secondary | ICD-10-CM | POA: Insufficient documentation

## 2014-01-11 DIAGNOSIS — Z8659 Personal history of other mental and behavioral disorders: Secondary | ICD-10-CM | POA: Diagnosis not present

## 2014-01-11 DIAGNOSIS — G40909 Epilepsy, unspecified, not intractable, without status epilepticus: Secondary | ICD-10-CM | POA: Insufficient documentation

## 2014-01-11 DIAGNOSIS — R509 Fever, unspecified: Secondary | ICD-10-CM | POA: Insufficient documentation

## 2014-01-11 DIAGNOSIS — Z3202 Encounter for pregnancy test, result negative: Secondary | ICD-10-CM | POA: Diagnosis not present

## 2014-01-11 LAB — BASIC METABOLIC PANEL
ANION GAP: 12 (ref 5–15)
BUN: 12 mg/dL (ref 6–23)
CHLORIDE: 101 meq/L (ref 96–112)
CO2: 27 mmol/L (ref 19–32)
CREATININE: 0.84 mg/dL (ref 0.50–1.10)
Calcium: 8.9 mg/dL (ref 8.4–10.5)
GFR calc Af Amer: >90 mL/min (ref >90)
GFR calc non Af Amer: >90 mL/min (ref >90)
Glucose, Bld: 98 mg/dL (ref 70–99)
POTASSIUM: 3.4 mmol/L — AB (ref 3.5–5.1)
Sodium: 140 mmol/L (ref 135–145)

## 2014-01-11 LAB — URINALYSIS, ROUTINE W REFLEX MICROSCOPIC
Bilirubin Urine: NEGATIVE
Glucose, UA: NEGATIVE mg/dL
Ketones, ur: NEGATIVE mg/dL
LEUKOCYTES UA: NEGATIVE
Nitrite: NEGATIVE
PROTEIN: 100 mg/dL — AB
SPECIFIC GRAVITY, URINE: 1.013 (ref 1.005–1.030)
UROBILINOGEN UA: 1 mg/dL (ref 0.0–1.0)
pH: 7 (ref 5.0–8.0)

## 2014-01-11 LAB — CARBAMAZEPINE LEVEL, TOTAL: Carbamazepine Lvl: 10.8 ug/mL (ref 4.0–12.0)

## 2014-01-11 LAB — CBC WITH DIFFERENTIAL/PLATELET
BASOS ABS: 0 10*3/uL (ref 0.0–0.1)
Basophils Relative: 0 % (ref 0–1)
Eosinophils Absolute: 0 10*3/uL (ref 0.0–0.7)
Eosinophils Relative: 0 % (ref 0–5)
HEMATOCRIT: 34.2 % — AB (ref 36.0–46.0)
Hemoglobin: 10.9 g/dL — ABNORMAL LOW (ref 12.0–15.0)
LYMPHS PCT: 24 % (ref 12–46)
Lymphs Abs: 3.1 10*3/uL (ref 0.7–4.0)
MCH: 26.3 pg (ref 26.0–34.0)
MCHC: 31.9 g/dL (ref 30.0–36.0)
MCV: 82.4 fL (ref 78.0–100.0)
MONO ABS: 0.8 10*3/uL (ref 0.1–1.0)
Monocytes Relative: 6 % (ref 3–12)
NEUTROS ABS: 8.9 10*3/uL — AB (ref 1.7–7.7)
Neutrophils Relative %: 70 % (ref 43–77)
Platelets: 288 10*3/uL (ref 150–400)
RBC: 4.15 MIL/uL (ref 3.87–5.11)
RDW: 13.4 % (ref 11.5–15.5)
WBC: 12.7 10*3/uL — AB (ref 4.0–10.5)

## 2014-01-11 LAB — URINE MICROSCOPIC-ADD ON

## 2014-01-11 LAB — CBG MONITORING, ED: GLUCOSE-CAPILLARY: 108 mg/dL — AB (ref 70–99)

## 2014-01-11 LAB — PREGNANCY, URINE: Preg Test, Ur: NEGATIVE

## 2014-01-11 MED ORDER — LEVETIRACETAM 500 MG PO TABS
500.0000 mg | ORAL_TABLET | Freq: Once | ORAL | Status: AC
  Administered 2014-01-11: 500 mg via ORAL
  Filled 2014-01-11 (×2): qty 1

## 2014-01-11 MED ORDER — LEVETIRACETAM 500 MG PO TABS: 500.0000 mg | ORAL_TABLET | Freq: Two times a day (BID) | ORAL | Status: DC

## 2014-01-11 MED ORDER — SODIUM CHLORIDE 0.9 % IV BOLUS (SEPSIS)
1000.0000 mL | Freq: Once | INTRAVENOUS | Status: AC
Start: 2014-01-11 — End: 2014-01-11
  Administered 2014-01-11: 1000 mL via INTRAVENOUS

## 2014-01-11 MED ORDER — SODIUM CHLORIDE 0.9 % IV BOLUS (SEPSIS)
1000.0000 mL | Freq: Once | INTRAVENOUS | Status: AC
  Administered 2014-01-11: 1000 mL via INTRAVENOUS

## 2014-01-11 MED ORDER — ACETAMINOPHEN 325 MG PO TABS
650.0000 mg | ORAL_TABLET | Freq: Once | ORAL | Status: AC
  Administered 2014-01-11: 650 mg via ORAL
  Filled 2014-01-11: qty 2

## 2014-01-11 NOTE — ED Notes (Signed)
Pharmacy called x 2 for dose.

## 2014-01-11 NOTE — ED Notes (Signed)
Pt has a hx of seizures, pt's mother reports pt had her first seizure at 0300 this morning and her second seizure within the hour after the first one. Pt's mother reports that the pt typically does not have two seizures back to back like she did tonight. Pt disoriented and lethargic upon assessment. CBG 133, HR 126.

## 2014-01-11 NOTE — ED Notes (Signed)
Patient transported to X-ray 

## 2014-01-11 NOTE — ED Notes (Signed)
Lab called; state they did not see order for urinalysis but have sample; will run it now.

## 2014-01-11 NOTE — ED Notes (Signed)
PT ambulated with baseline gait; VSS; A&Ox3; no signs of distress; respirations even and unlabored; skin warm and dry; no questions upon discharge.  

## 2014-01-11 NOTE — ED Notes (Signed)
Pt wheeled to bathroom; ambulated with no distress.

## 2014-01-11 NOTE — Discharge Instructions (Signed)
Call your neurologist for further evaluation of your breakthrough seizures. Call for a follow up appointment with a Family or Primary Care Provider.  Return if Symptoms worsen.   Take medication as prescribed.

## 2014-01-11 NOTE — ED Provider Notes (Signed)
CSN: [id number]     Arrival date & time 01/11/14  0448 History   First MD Initiated Contact with Patient 01/11/14 [phone]     Chief Complaint  Patient presents with  . Seizures     (Consider location/radiation/quality/duration/timing/severity/associated sxs/prior Treatment) HPI Comments: Katelyn [name] is a 28 y.o. young woman with history of seizure disorder, complex cranial malformation requiring VP shunt, presenting to the emergency department with a chief complaint of 2 seizures episodes today. Patient's mother reports an initial "grand mal" seizure approximately 3 AM occurred while sleeping. Patient's mother reports second seizure aproximately 15 minutes later. Patient never returned to baseline in between seizures, and seizure activity occurs when the patient is awake. Patient's mother reports normal baseline mentation at this time. Patient's mother reports patient felt warm 3 days ago, no nausea, vomiting, diarrhea, cough. Complains of nasal congestion over the past several days but not other infectious source. No recent medication change. Patient's mother reports history of shunt infection, 3 years ago, but is not presenting in a similar way.  Patient's normal seizure episodes occur around menses, last normal menstrual period December18th. No change in medication, compliant with anticonvulsant therapy. Patient at baseline not oriented to place or time.  Patient is a 28 y.o. female presenting with seizures. The history is provided by the patient. No language interpreter was used.  Seizures   Past Medical History  Diagnosis Date  . Hypertension   . Diabetes mellitus without complication   . Seizures   . Congenital hydrocephalus     has VP shunt  . Insomnia   . Agenesis of corpus callosum   . Intellectual delay    Past Surgical History  Procedure Laterality Date  . Shunt replacement  2011    Was readmitted 1 week later with peritoneal infection from shunt  . Wisdom tooth extraction      Family History  Problem Relation Age of Onset  . Hypertension Mother   . Hypertension Father   . Diabetes Father   . Diabetes Maternal Uncle   . Cancer Maternal Grandmother   . Thyroid disease Maternal Grandmother   . Diabetes Maternal Grandmother   . Heart disease Maternal Grandfather   . Cancer Paternal Grandfather     Died at 2171   History  Substance Use Topics  . Smoking status: Never Smoker   . Smokeless tobacco: Never Used  . Alcohol Use: No   OB History    Gravida Para Term Preterm AB TAB SAB Ectopic Multiple Living   0 0 0 0 0 0 0 0 0 0      Review of Systems  Constitutional: Negative for fever and chills.  HENT: Positive for congestion.   Respiratory: Negative for cough.   Gastrointestinal: Negative for nausea, vomiting, diarrhea and constipation.  Neurological: Positive for seizures.      Allergies  Review of patient's allergies indicates no known allergies.  Home Medications   Prior to Admission medications   Medication Sig Start Date End Date Taking? Authorizing Provider  [name] 300 MG 12 hr capsule TAKE 2 CAPSULES BY MOUTH TWICE DAILY 01/06/14  Yes Elveria Risingina Goodpasture, NP  lisinopril (PRINIVIL,ZESTRIL) 20 MG tablet Take 1 tablet (20 mg total) by mouth daily. 03/27/13  Yes Leona SingletonMaria T Thekkekandam, MD  metFORMIN (GLUCOPHAGE) 1000 MG tablet TAKE 1 TABLET BY MOUTH TWICE DAILY WITH A MEAL 10/14/13  Yes Leona SingletonMaria T Thekkekandam, MD  metoprolol succinate (TOPROL-XL) 100 MG 24 hr tablet Take 1 tablet (100 mg total) by mouth daily.  Take with or immediately following a meal. 03/27/13  Yes Leona SingletonMaria T Thekkekandam, MD  traZODone (DESYREL) 50 MG tablet Take 1 tablet at bedtime 10/03/12  Yes Elveria Risingina Goodpasture, NP  Lancets [facility](ONETOUCH ULTRASOFT) lancets Use as instructed 10/26/12   Leona SingletonMaria T Thekkekandam, MD  metoprolol succinate (TOPROL-XL) 100 MG 24 hr tablet TAKE 1 TABLET BY MOUTH EVERY DAY WITH A MEAL Patient not taking: Reported on 01/11/2014 11/14/13   Leona SingletonMaria T Thekkekandam, MD    BP 149/102 mmHg  Pulse 120  Temp(Src) 98.7 F (37.1 C) (Oral)  Resp 21  Ht 5\' 5"  (1.651 m)  Wt 162 lb (73.483 kg)  BMI 26.96 kg/m2  SpO2 98%  LMP 01/02/2014 (Approximate) Physical Exam  Constitutional: She is oriented to person, place, and time. She appears well-developed and well-nourished. No distress.  Tactile temperature.  HENT:  Head: Atraumatic.  Eyes: Conjunctivae and EOM are normal. Pupils are equal, round, and reactive to light.  Neck: Normal range of motion. Neck supple.  Cardiovascular: Regular rhythm.  Tachycardia present.   Pulmonary/Chest: Effort normal. No respiratory distress. She has no wheezes. She has no rales.  Abdominal: Soft. There is no tenderness. There is no rigidity, no rebound and no guarding.  Neurological: She is alert and oriented to person, place, and time. No cranial nerve deficit or sensory deficit. GCS eye subscore is 4. GCS verbal subscore is 5. GCS motor subscore is 6.  Disoriented to date, city and state.  Speech is clear and goal oriented, follows commands II-Visual fields were normal.   III/IV/VI-Pupils were equal and reacted.  V/VII-no facial droop.   VIII-normal.   Motor: Strength 5/5 to upper and lower extremities bilaterally. Moves all 4 extremities equally. Sensory: normal sensation to upper and lower extremities.  Cerebellar: Normal finger to nose bilaterally No pronator drift.  Skin: Skin is warm and dry. She is not diaphoretic.  Psychiatric: She has a normal mood and affect. Her behavior is normal.  Nursing note and vitals reviewed.   ED Course  Procedures (including critical care time) Labs Review Labs Reviewed  CBC WITH DIFFERENTIAL - Abnormal; Notable for the following:    WBC 12.7 (*)    Hemoglobin 10.9 (*)    HCT 34.2 (*)    Neutro Abs 8.9 (*)    All other components within normal limits  BASIC METABOLIC PANEL - Abnormal; Notable for the following:    Potassium 3.4 (*)    All other components within normal limits   URINALYSIS, ROUTINE W REFLEX MICROSCOPIC - Abnormal; Notable for the following:    Hgb urine dipstick TRACE (*)    Protein, ur 100 (*)    All other components within normal limits  CBG MONITORING, ED - Abnormal; Notable for the following:    Glucose-Capillary 108 (*)    All other components within normal limits  CARBAMAZEPINE LEVEL, TOTAL  PREGNANCY, URINE  URINE MICROSCOPIC-ADD ON    Imaging Review Dg Chest 2 View  01/11/2014   CLINICAL DATA:  Fever, diabetes and hypertension, seizures  EXAM: CHEST - 2 VIEW  COMPARISON:  06/11/2009  FINDINGS: Normal heart size and vascularity. Lungs remain clear. No focal pneumonia, collapse or consolidation. Negative for edema, effusion or pneumothorax. Trachea midline. Stable shunt tubes, 1 new and 1 remote. Monitor leads overlie the chest. Normal bowel gas pattern.  IMPRESSION: No acute chest process.   Electronically Signed   By: Ruel Favorsrevor  Shick M.D.   On: 01/11/2014 08:22   Ct Head Wo Contrast  01/11/2014  CLINICAL DATA:  Seizure.  EXAM: CT HEAD WITHOUT CONTRAST  TECHNIQUE: Contiguous axial images were obtained from the base of the skull through the vertex without intravenous contrast.  COMPARISON:  06/11/2009  FINDINGS: Plagiocephaly skull shape unchanged. Defect over the high midline parotid occipital as well as changes over the left skull unchanged. Left parietal ventriculostomy catheter unchanged with tip along the right lateral border of a large midline CSF space superimposed over the interhemispheric fissure. This prominent CSF space is slightly smaller overall. There is no focal mass or mass effect. No acute hemorrhage as remainder of the exam is unchanged.  IMPRESSION: Chronic stable congenital findings as described with a left posterior parietal ventriculostomy catheter unchanged. The ventriculostomy catheter position is unchanged along the right lateral border of a prominent midline CSF space which appears to communicate with the lateral  ventricles as this is overall slightly smaller.   Electronically Signed   By: Elberta Fortis M.D.   On: 01/11/2014 11:14     EKG Interpretation None      MDM   Final diagnoses:  Fever  Seizure   Pt with history of seizure presents with 2 seizures, today atypical or of history of seizure disorder. Patient returned to baseline mental status prior to arrival to ED. Patient with tactile fever, rectal temperature 100.6. UA and chest x-ray ordered. Ativan not given at this time do to return to baseline status, if patient has repeat seizure will administer.  Chest x-ray negative for fever, CBC shows slight leukocytosis at 12.7, anemia 10.9 BMP without concerning abnormalities. Awaiting UA results. Reevaluation patient resting comfortable in room heart rate 117, no further seizure activity. [phone] call to Lab to see where delay of UA result is. 0954 Contacted lab for UA results. UA negative for infection. 1014 discussed patient history, condition with Dr. Amada Jupiter, neurologist, who advises CT head, if normal second agent of Keppra 500 BID. CT head with chronic changes, no new findings. Plan to discharge home with Keppra, follow up with neurology outpatient. Discussed lab results, imaging results, and treatment plan with the patient and patient's mother. Return precautions given. Reports understanding and no other concerns at this time. Patient is stable for discharge at this time.  Mellody Drown, PA-C 01/11/14 1205  Dione Booze, MD 01/18/14 2258

## 2014-01-11 NOTE — ED Notes (Signed)
Patient transported to CT without distress 

## 2014-01-11 NOTE — ED Notes (Signed)
Lab again called in reference to urinalysis.

## 2014-01-11 NOTE — ED Notes (Signed)
PA aware of pt's rectal temp.

## 2014-01-11 NOTE — ED Notes (Signed)
PA at BS.  

## 2014-01-12 ENCOUNTER — Other Ambulatory Visit: Payer: Self-pay | Admitting: Family Medicine

## 2014-01-24 ENCOUNTER — Ambulatory Visit: Payer: Medicare Other | Admitting: Family Medicine

## 2014-02-03 ENCOUNTER — Encounter: Payer: Self-pay | Admitting: Neurology

## 2014-02-03 ENCOUNTER — Ambulatory Visit (INDEPENDENT_AMBULATORY_CARE_PROVIDER_SITE_OTHER): Payer: Medicare Other | Admitting: Neurology

## 2014-02-03 VITALS — BP 140/110 | HR 96 | Resp 20 | Ht 63.5 in | Wt 154.0 lb

## 2014-02-03 DIAGNOSIS — G4701 Insomnia due to medical condition: Secondary | ICD-10-CM

## 2014-02-03 DIAGNOSIS — IMO0002 Reserved for concepts with insufficient information to code with codable children: Secondary | ICD-10-CM | POA: Insufficient documentation

## 2014-02-03 DIAGNOSIS — G40309 Generalized idiopathic epilepsy and epileptic syndromes, not intractable, without status epilepticus: Secondary | ICD-10-CM | POA: Insufficient documentation

## 2014-02-03 DIAGNOSIS — G40409 Other generalized epilepsy and epileptic syndromes, not intractable, without status epilepticus: Secondary | ICD-10-CM

## 2014-02-03 DIAGNOSIS — Q02 Microcephaly: Secondary | ICD-10-CM | POA: Insufficient documentation

## 2014-02-03 DIAGNOSIS — R351 Nocturia: Secondary | ICD-10-CM

## 2014-02-03 MED ORDER — DESMOPRESSIN ACE REFRIGERATED 0.01 % NA SOLN: 1.0000 [drp] | Freq: Every day | NASAL | Status: DC

## 2014-02-03 MED ORDER — ZOLPIDEM TARTRATE 5 MG PO TABS: ORAL_TABLET | ORAL | Status: DC

## 2014-02-03 NOTE — Progress Notes (Signed)
SLEEP MED[facility]INE CLINIC   Provider:  Melvyn Novasarmen  Carmon Brigandi, M D  Referring Provider: Leona [name], Katelyn [name], * Primary Care Physician:  Katelyn [name], Maria, MD  Chief Complaint  Patient presents with  . NP Goodpasture, NP    Rm 10, mother    HPI:  Katelyn [name] is a 29 y.o. female seen here as a referral/  from NP [name] and  Dr. Benjamin [name] for a sleep consultation, Katelyn [name] has been a patient of Katelyn [name] since age 29, and has regular follow-up with a pediatric neurology group. It was Dr. Hickling'spractitioner, Katelyn [name], who referred her today for the sleep consultation because surely she has a increasing difficulties to sleep through the night. Her mother reports that she will fall asleep but simply will not stay asleep and seems to wake up every hour for at least a couple of minutes. This is exhausting to her mother,  who monitors her sleep and seizure activity. Her extensive neurologic history is quoted below it was [name]'s referral note. The patient has in addition diabetes, hypertension, and abnormal cranium, and in her review of systems endorsed daytime sleepiness, nocturnal insomnia for sleep maintenance, seizures, frequent coughing. The patient has a VP shunt since birth, last replaced in  2011.  Her mother answers her questions, the patient goes to bed between 9 and 11 PM and will often not be asleep before midnight. Sleep is interrupted by frequent arousals, going to the bathroom. She believes the bathroom breaks are cause of her waking up frequently. The patient rises at 8 AM, she wakes up punctually at that time without an alarm be needed, she actually wakes her mom. The patient sleeps in bed with multiple pillows she usually falls asleep on her side. She wakes usually up on her side as well. She takes no naps in daytime.  She is a quiet sleeper, but has severe fragmentation of sleep. The sleep difficulties started about 3-4 years ago, close to the time  of her last shunt revision.   She exercises with her mother daily, she has no school or job or social regular obligations.   The patient is not taking any diuretic medication, and according to her mother is not hydrating excessively, and hydrate throughout the day and not shortly before bedtime. She has so far not had a urology workup. Her mother reports that she be she is not snoring at night, she does not gasp for breath does not seem air hungry and is not known to sleep walk sleep talk or has night terrors. At this time she is on multiple medications that should help her to sleep through the night including carbamazepine, trazodone, Toprol. Keppra is at least not known to interrupt sleep and lisinopril is not a diuretic. She is also on Glucophage which she takes twice daily with food at 1000 mg. Her blood pressure here today was elevated. Diastolic value at 140/110 mmHg today has been the highest blood pressure in a while measured. The patient has a TV in her bedroom and watches TV usually in the evening hours. This may hinder her to go to sleep at an earlier time. The patient uses a timer for 1 hour on her TV. I explained to the patient and her mother that the blue screen lites may also be a hindrance in achieving deep sounds sleep when emitted. She may find it easier to sleep sound by using a sound machine and audio bulk or just a radio on a low volume. She needs  she does not drink caffeine and usually drinks water. She does not smoke and she is not using any alcohol or illegal drugs.   Referral visit note , Katelyn Rising , NP:  [name] is a 29 y.o. 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 She has focal motor seizures and intellectual delay. Katelyn [name] was last seen October 03, 2012.    Mother reports  Katelyn [name] continues to have occasional brief seizures, usually associated with her menses.  She has been helping her loose weight because of problems with elevated blood pressure and blood sugars. Katelyn [name] has had ongoing problems with sleep arousals. Mom says that she sleeps very little despite all efforts to help her to go to bed and stay asleep. They have been walking for exercise and Mom said that she can tell no difference. Katelyn [name] was tried on Clonidine when she was younger, and then Melatonin and Clonazepam when she was older. None of these agents helped. Mom said that she has tried OTC sleep medications with no benefit. More recently she has been tried on Trazodone with no change in her sleep habits. Mom said that she will go to bed on time, but is awake and out of bed at least every hour. Mom says that she usually gets up, urinates and goes back to bed when told to do so. Mom says that she does not urinate frequently during the day and that her blood sugars are in good control. Sometimes Mom finds her in the bathroom with no need to urinate, but simply in the bathroom. It is not clear why she is there. Mom has not seen behaviors that suggest sleepwalking, or seizure behaviors, other than at times when she is simply standing in the bathroom. Mom sends her back to bed, and about an hour later, she is up again. Analyse then gets up very early each morning and stays up. She does not sleep during the day, even when she looks and acts very tired. Mom is fatigued from waking all night and monitoring Katelyn [name]'s activities. Her seizures are under fairly good control, with only an occasional brief seizure associated with her menses. She is taking and tolerating [name] for her seizure disorder, and I will  not make changes in her epilepsy treatment at this time. Katelyn [name] has ongoing problems with insomnia and sleep arousals, despite good sleep hygiene. She has been tried on Clonidine, Melatonin, Clonazepam, OTC medications and currently Trazodone. I am concerned about her lack of sleep and recommended that we refer her for an evaluation by a sleep specialist. While it is not evident that she is having any seizure behaviors in her sleep that awaken her, it would be beneficial to evaluate this as well as part of the sleep study. I will contact her mother when I receive the sleep study results.    Review of Systems: Insomnia, seizures, cognitive delay. Sleepiness in daytime . Fatigued. Nocturia up to 7 times !   Medical History  Diagnosis Date  . Hypertension   . Diabetes mellitus without complication   . Seizures   . Congenital hydrocephalus     has VP shunt  . Insomnia   . Agenesis of corpus callosum   .  Intellectual delay    Hospitalizations: No.,  Head Injury: No.,  Nervous System Infections: No. , Immunizations up to date: Yes.   Fall - one in January 2018 , stumbled while getting into a car.    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 She has focal motor seizures and intellectual delay. Katelyn [name] also has chronic medical problems of hypertension and diabetes.   Surgical History Past Surgical History  Procedure Laterality Date  . Shunt replacement  2011    Was readmitted 1 week later with peritoneal infection from shunt  . Wisdom tooth extraction      Family History family history includes Cancer in her maternal  grandmother and paternal grandfather; Diabetes in her father, maternal grandmother, and maternal uncle; Heart disease in her maternal grandfather; Hypertension in her father and mother; Thyroid disease in her maternal grandmother. Family History is otherwise negative for migraines, seizures, cognitive impairment, blindness, deafness, birth defects, chromosomal disorder, autism.  Social History History   Social History  . Marital Status: Single    Spouse Name: N/A    Number of Children: N/A  . Years of Education: N/A   Social History Main Topics  . Smoking status: Never Smoker   . Smokeless tobacco: Never Used  . Alcohol Use: No  . Drug Use: No  . Sexual Activity: No   Other Topics Concern  . None   Social History Narrative   Lives with mother.   Denies pets.   Denies smoke exposure   Denies tobacco, drugs, or alcohol use   Hobbies: Mostly listens to music, does lots of writing, activity books, goes shopping   Went to [employer] school until 29 years old.   Brushes own teeth; mom helps her bathe; fixes own drinks; mom prepares meals - takes care of self and mom helps.   Denies sexual relationship.   Denies concern for STDs.   Caffeine none         Educational level: 12th grade School , completed.  Hobbies/Interest: likes music, movies and walking School comments: Arienna lives with her family at the private  home.  Physical Exam BP 140/110 mmHg  Pulse 96  Resp 20  Ht 5' 3.5" (1.613 m)  Wt 154 lb (69.854 kg)  BMI 26.85 kg/m2  LMP 01/02/2014 (Approximate) General: well developed, well nourished female, seated in exam room, in no evident distress. She is quiet but attentive.  Head: She has a cone shaped  Head.  She has oxycephaly, plagiocephaly, and megencephaly.  She has a VP shunt and esotropia.  Mallampati 3 , No TMJ and no bruxism . No tonsillectomy , ROM for neck is restricted , more rigidity in ROM. She can shrug [name]  shoulders.  Neck: supple with no carotid or  supraclavicular bruits.  Respiratory: lungs clear to auscultation  Cardiovascular: regular rate  no murmurs   Neurologic Exam  Mental Status: Awake and fully alert. She has mental retardation, mild to moderate . She is able to answer some questions but is unable to volunteer information.  Mother answered for her .  Cranial Nerves: Fundoscopic exam reveals sharp disc margins.  Pupils equal, briskly reactive to light. She has esotropia.  Visual fields appear full to confrontation. Hearing intact and  symmetric to finger rubbing , tuning fork and  whisper. Facial sensation intact.  Face, tongue, palate move normally and symmetrically. Neck flexion and extension normal.  Motor:  Elevated tone in right biceps and triceps and  Brisker reflexes on the right  DTR.  Sensory: Intact to touch and temperature in all extremities,  Coordination: Rapid alternating movements slowed in all extremities. Finger-to-nose and heel-to-shin performed accurately bilaterally.Romberg negative.  Gait and Station: Arises from chair without difficulty. Stance is wide based.  Gait demonstrates normal stride length and balance. Able to heel and toe walk fairly well.  She could not perform a tandem due to  ataxia.  Reflexes: Diminished, brisker on the right. Very brisk in [name] patellae, no clonus,   Toes downgoing.   Assessment and Plan   Epworth score 1  , Fatigue severity score 9  , depression score n/a  Assessment:  After physical and neurologic examination, review of laboratory studies, imaging, neurophysiology testing and pre-existing records, assessment is : 1)  To my surprise Katelyn [name] is not excessively daytime sleepy and reports that she can stay awake for 10-12 hours during a long car ride to [place]. Her nocturnal sleep however seems to be very fragmented and it is not yet clear to me if nocturia causes a sleep fragmentation or if there is another cause and the patient wakes up and then goes to the bathroom.  However the frequent bathroom visits at night seem to be a problem and has been observed by her mother at increasing frequency for the last 3 years. It is unclear if there is additional apnea present the patient is not known to snore but she does have some abnormal facial features and her upper airway may very well be restricted more than the can see in our exam. It would be well worthwhile to also look at this patient meets desmopressin considering her underlying brain condition. Miss Cahoon does not have enuresis except when having a seizure at night. She is not known to have diabetes insipidus and she is not an excessive fluid drinker.   The patient was advised of the nature of the diagnosed sleep disorder , the treatment options and risks for general a health and wellness arising from not treating the condition. Visit duration was 45 minutes.  Face to face time 45 minutes with over 50 % time spent in discussion of the symptoms, time frame of onset and causality, tests to be ordered and differential diagnosis. I reviewed notes and labs, Not imaging.    Plan:  Treatment plan and additional workup :  I will order a overnight sleep study with a seizure montage from his [facility] and also evaluate if desmopressin as a nasal spray may help her to reduce her nocturnal bathroom breaks. Since the trazodone seems not to have helped her very much and in the past melatonin and some other sleep aids over-the-counter have failed I think she is okay to stop the trazodone I would go to hold the dose that she is currently taking for the next 14 days and then discontinue the trazodone. I would much rather use a sleep aid such as Unisom to help her get drowsy if he could also try Belsomra , but this may be a battle with her insurance company.  Rv after sleep study with NP or me for 30 minutes.      Porfirio Mylar Cailan Antonucci MD  02/03/2014

## 2014-02-05 ENCOUNTER — Ambulatory Visit: Payer: Medicare Other | Admitting: Family

## 2014-02-06 ENCOUNTER — Ambulatory Visit: Payer: Medicare Other | Admitting: Family Medicine

## 2014-02-07 ENCOUNTER — Ambulatory Visit: Payer: Medicare Other | Admitting: Family Medicine

## 2014-02-19 ENCOUNTER — Encounter: Payer: Self-pay | Admitting: Family Medicine

## 2014-02-19 ENCOUNTER — Ambulatory Visit (INDEPENDENT_AMBULATORY_CARE_PROVIDER_SITE_OTHER): Payer: Medicare Other | Admitting: Family Medicine

## 2014-02-19 VITALS — BP 139/98 | HR 102 | Temp 98.2°F | Ht 64.0 in | Wt 150.0 lb

## 2014-02-19 DIAGNOSIS — I10 Essential (primary) hypertension: Secondary | ICD-10-CM

## 2014-02-19 DIAGNOSIS — Z30011 Encounter for initial prescription of contraceptive pills: Secondary | ICD-10-CM

## 2014-02-19 MED ORDER — NORGESTIMATE-ETH ESTRADIOL 0.25-35 MG-MCG PO TABS: 1.0000 | ORAL_TABLET | Freq: Every day | ORAL | Status: DC

## 2014-02-19 NOTE — Progress Notes (Signed)
Patient ID: Katelyn [name], female   DOB: 1985-02-14, 29 y.o.   MRN: [id number] Subjective:   CC: Follow up contraception, HTN HPI:   F/u contraception Gave list at last visit. Mom decided OCP is likely best. Mom can give daily. Seizures happening more often around periods. Neurologist is amenable per mom. No hx or FH of clots. No h/o migraines. No h/o heart attack or stroke. NKDA LMP 1/20, every 30 days, 3 days each, 4-5 pads per day.  F/u HTN Metoprolol XL and lisinopril daily with no side effects. Working on weight loss.  BP at home is 110-120s/80s. No chest pain or dyspnea. Got back down to 150s lbs, walking 3 days weekly for about 10 minutes. Drinks mostly water. Does not eat much carbs.   Review of Systems - Per HPI.  PMH - HTN, prediabetes, seizures, congenital hydrocephalus with VP shunt, insomnia, agenesis of corpus callosum, and intellectual delay. Smoking status: nonsmoker    Objective:  Physical Exam BP 139/98 mmHg  Pulse 102  Temp(Src) 98.2 F (36.8 C) (Oral)  Ht 5\' 4"  (1.626 m)  Wt 150 lb (68.04 kg)  BMI 25.73 kg/m2  LMP 02/05/2014 (Exact Date) GEN: NAD CV: RRR, no m/r/g, 2+ B radial pulses PULM: CTAB, normal effort LE: No Le edema or calf tenderness    Assessment:     Katelyn FusiJeelisha Husk is a 29 y.o. female here for f/u HTN and discuss contraception.    Plan:     # See problem list and after visit summary for problem-specific plans. - murmur heard last visit; none heard today.  # Health Maintenance: sprintec prescribed today.  Follow-up: Follow up in 2 months for f/u HTN.     Leona SingletonMaria T Kacee Sukhu, MD [facility]

## 2014-02-19 NOTE — Patient Instructions (Signed)
Good to see you.  I am prescribing sprintec that you will take daily. I will send a note to let your neurologist know what we started. I am including information below about sprintec. If you have any leg swelling, difficulty breathing, or other concerns, seek immediate care. Take at the same time daily.  Follow up late March for BP recheck and to check your basic metabolic panel. In the meantime, keep eating well, and add 1 day of exercise to equal 4 days.  I did not hear a murmur today.  Best,  Leona Singleton, MD  Ethinyl Estradiol; Norgestimate tablets What is this medicine? ETHINYL ESTRADIOL; NORGESTIMATE (ETH in il es tra DYE ole; nor JES ti mate) is an oral contraceptive. The products combine two types of female hormones, an estrogen and a progestin. They are used to prevent ovulation and pregnancy. Some products are also used to treat acne in females. This medicine may be used for other purposes; ask your health care provider or pharmacist if you have questions. COMMON BRAND NAME(S): Estarylla, MONO-LINYAH, MonoNessa, Ortho Tri-Cyclen, Ortho Tri-Cyclen Lo, Ortho-Cyclen, Previfem, Sprintec, Tri-Estarylla, TRI-LINYAH, Tri-Lo-Sprintec, Tri-Previfem, Tri-Sprintec, Imelda Pillow What should I tell my health care provider before I take this medicine? They need to know if you have or ever had any of these conditions: -abnormal vaginal bleeding -blood vessel disease or blood clots -breast, cervical, endometrial, ovarian, liver, or uterine cancer -diabetes -gallbladder disease -heart disease or recent heart attack -high blood pressure -high cholesterol -kidney disease -liver disease -migraine headaches -stroke -systemic lupus erythematosus (SLE) -tobacco smoker -an unusual or allergic reaction to estrogens, progestins, other medicines, foods, dyes, or preservatives -pregnant or trying to get pregnant -breast-feeding How should I use this medicine? Take this medicine by mouth.  To reduce nausea, this medicine may be taken with food. Follow the directions on the prescription label. Take this medicine at the same time each day and in the order directed on the package. Do not take your medicine more often than directed. Contact your pediatrician regarding the use of this medicine in children. Special care may be needed. This medicine has been used in female children who have started having menstrual periods. A patient package insert for the product will be given with each prescription and refill. Read this sheet carefully each time. The sheet may change frequently. Overdosage: If you think you have taken too much of this medicine contact a poison control center or emergency room at once. NOTE: This medicine is only for you. Do not share this medicine with others. What if I miss a dose? If you miss a dose, refer to the patient information sheet you received with your medicine for direction. If you miss more than one pill, this medicine may not be as effective and you may need to use another form of birth control. What may interact with this medicine? -acetaminophen -antibiotics or medicines for infections, especially rifampin, rifabutin, rifapentine, and griseofulvin, and possibly penicillins or tetracyclines -aprepitant -ascorbic acid (vitamin C) -atorvastatin -barbiturate medicines, such as phenobarbital -bosentan -carbamazepine -caffeine -clofibrate -cyclosporine -dantrolene -doxercalciferol -felbamate -grapefruit juice -hydrocortisone -medicines for anxiety or sleeping problems, such as diazepam or temazepam -medicines for diabetes, including pioglitazone -mineral oil -modafinil -mycophenolate -nefazodone -oxcarbazepine -phenytoin -prednisolone -ritonavir or other medicines for HIV infection or AIDS -rosuvastatin -selegiline -soy isoflavones supplements -St. John's wort -tamoxifen or raloxifene -theophylline -thyroid  hormones -topiramate -warfarin This list may not describe all possible interactions. Give your health care provider a list of all the medicines, herbs,  non-prescription drugs, or dietary supplements you use. Also tell them if you smoke, drink alcohol, or use illegal drugs. Some items may interact with your medicine. What should I watch for while using this medicine? Visit your doctor or health care professional for regular checks on your progress. You will need a regular breast and pelvic exam and Pap smear while on this medicine. You should also discuss the need for regular mammograms with your health care professional, and follow his or her guidelines for these tests. This medicine can make your body retain fluid, making your fingers, hands, or ankles swell. Your blood pressure can go up. Contact your doctor or health care professional if you feel you are retaining fluid. Use an additional method of contraception during the first cycle that you take these tablets. If you have any reason to think you are pregnant, stop taking this medicine right away and contact your doctor or health care professional. If you are taking this medicine for hormone related problems, it may take several cycles of use to see improvement in your condition. Smoking increases the risk of getting a blood clot or having a stroke while you are taking birth control pills, especially if you are more than 28 years old. You are strongly advised not to smoke. This medicine can make you more sensitive to the sun. Keep out of the sun. If you cannot avoid being in the sun, wear protective clothing and use sunscreen. Do not use sun lamps or tanning beds/booths. If you wear contact lenses and notice visual changes, or if the lenses begin to feel uncomfortable, consult your eye care specialist. In some women, tenderness, swelling, or minor bleeding of the gums may occur. Notify your dentist if this happens. Brushing and flossing your teeth  regularly may help limit this. See your dentist regularly and inform your dentist of the medicines you are taking. If you are going to have elective surgery, you may need to stop taking this medicine before the surgery. Consult your health care professional for advice. This medicine does not protect you against HIV infection (AIDS) or any other sexually transmitted diseases. What side effects may I notice from receiving this medicine? Side effects that you should report to your doctor or health care professional as soon as possible: -breast tissue changes or discharge -changes in vaginal bleeding during your period or between your periods -chest pain -coughing up blood -dizziness or fainting spells -headaches or migraines -leg, arm or groin pain -severe or sudden headaches -stomach pain (severe) -sudden shortness of breath -sudden loss of coordination, especially on one side of the body -speech problems -symptoms of vaginal infection like itching, irritation or unusual discharge -tenderness in the upper abdomen -vomiting -weakness or numbness in the arms or legs, especially on one side of the body -yellowing of the eyes or skin Side effects that usually do not require medical attention (report to your doctor or health care professional if they continue or are bothersome): -breakthrough bleeding and spotting that continues beyond the 3 initial cycles of pills -breast tenderness -mood changes, anxiety, depression, frustration, anger, or emotional outbursts -increased sensitivity to sun or ultraviolet light -nausea -skin rash, acne, or brown spots on the skin -weight gain (slight) This list may not describe all possible side effects. Call your doctor for medical advice about side effects. You may report side effects to FDA at 1-800-FDA-1088. Where should I keep my medicine? Keep out of the reach of children. Store at room temperature between 15 and  30 degrees C (59 and 86 degrees F).  Throw away any unused medicine after the expiration date. NOTE: This sheet is a summary. It may not cover all possible information. If you have questions about this medicine, talk to your doctor, pharmacist, or health care provider.  2015, Elsevier/Gold Standard. (2007-12-20 13:40:47)

## 2014-02-20 NOTE — Assessment & Plan Note (Signed)
BP mildly elevated in clinic, likely white coat htn, with reported good control at home. - continue current regimen.  - BMET march - Continue good work with keeping carbs to minimum. Increase to 4 days walking/ week.

## 2014-02-20 NOTE — Assessment & Plan Note (Signed)
Mom makes medical decisions for Katelyn [name] and chooses OCP which she will administer daily. No higher risk for DVT/stroke.  - Sprintec rx'ed; discussed risks/benefits, taking daily, staying active, notifying/seeking immediate care with any sign of clot or neurologic change - mild decreased OCP activity with carbatrol but pt not sexually active. - Will fwd note to Neurologist so aware of medication.

## 2014-02-27 ENCOUNTER — Other Ambulatory Visit: Payer: Self-pay | Admitting: Family Medicine

## 2014-02-28 ENCOUNTER — Telehealth: Payer: Self-pay | Admitting: Family Medicine

## 2014-02-28 DIAGNOSIS — Z30011 Encounter for initial prescription of contraceptive pills: Secondary | ICD-10-CM

## 2014-02-28 NOTE — Telephone Encounter (Signed)
Patient request prescription for birth control pills more affordable to be sent to [facility] [address] Please,  Follow up with Patient.

## 2014-03-03 ENCOUNTER — Other Ambulatory Visit: Payer: Self-pay | Admitting: Family Medicine

## 2014-03-03 DIAGNOSIS — I10 Essential (primary) hypertension: Secondary | ICD-10-CM

## 2014-03-03 MED ORDER — NORGESTIMATE-ETH ESTRADIOL 0.25-35 MG-MCG PO TABS
1.0000 | ORAL_TABLET | Freq: Every day | ORAL | Status: DC
Start: 2014-03-03 — End: 2014-03-10

## 2014-03-03 NOTE — Telephone Encounter (Signed)
Please let them know I am not sure which would be cheaper with her insurance but I checked and sprintec is $9 for 1 month supply at [facility] so [facility] [address]. However, if they really want to get it at the other pharmacy, they may need to call their insurance provider to ask which is covered and let me know and I will be happy to send it in.  Thanks!  Leona SingletonMaria T Stafford Riviera, MD

## 2014-03-04 ENCOUNTER — Other Ambulatory Visit: Payer: Self-pay | Admitting: Family Medicine

## 2014-03-04 NOTE — Telephone Encounter (Signed)
LMTCB to advise as below. Will try again later. Payton Prinsen, CMA. 

## 2014-03-05 ENCOUNTER — Encounter: Payer: Self-pay | Admitting: Family

## 2014-03-05 ENCOUNTER — Ambulatory Visit (INDEPENDENT_AMBULATORY_CARE_PROVIDER_SITE_OTHER): Payer: Medicare Other | Admitting: Family

## 2014-03-05 VITALS — BP 126/80 | HR 94 | Ht 62.5 in | Wt 152.8 lb

## 2014-03-05 DIAGNOSIS — G40319 Generalized idiopathic epilepsy and epileptic syndromes, intractable, without status epilepticus: Secondary | ICD-10-CM

## 2014-03-05 DIAGNOSIS — G40311 Generalized idiopathic epilepsy and epileptic syndromes, intractable, with status epilepticus: Secondary | ICD-10-CM

## 2014-03-05 MED ORDER — CARBATROL 300 MG PO CP12: 600.0000 mg | ORAL_CAPSULE | Freq: Two times a day (BID) | ORAL | Status: DC

## 2014-03-05 MED ORDER — LEVETIRACETAM 500 MG PO TABS: 500.0000 mg | ORAL_TABLET | Freq: Two times a day (BID) | ORAL | Status: DC

## 2014-03-05 NOTE — Progress Notes (Signed)
Patient: Katelyn [name] MRN: [id number] Sex: female DOB: April 21, 1985  Provider: Elveria Rising, NP Location of Care: [facility]  Note type: Routine return visit  History of Present Illness: Referral Source: Dr. Simone Curia History from: her mother Chief Complaint: Seizure Disorder  Katelyn [name] is a 29 y.o. 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 She has focal motor seizures and intellectual delay. Katelyn [name] was last seen October 03, 2013.  Today her mother reports that Katelyn [name] had a seizure in December 2015 that lasted for 15 minutes. She was taken by ambulance to the ER and admitted. Mom said that [name]  1 twice per day was started in the hospital, and that Katelyn [name] was seizure free until she ran out of the 30 day supply of medication that was given to her at discharge. After she finished the bottle of medication in January, Mom said that Katelyn [name] began having seizures again, and the seizures are occurring more frequently. In the past, Katelyn [name] typically had one brief seizure per month, usually associated with her menstrual cycle. After she finished taking [name], she has been having 2 seizures per week. With the most recent seizure, Mom said that Katelyn [name] was also incontinent of urine and stool, which she had never done before.   Katelyn [name] has had ongoing problems with sleep. She has been evaluated by Dr Golden Hurter and a sleep study was planned. Unfortunately, Katelyn [name] developed a GI illness and the sleep study had to be rescheduled.   Review of Systems: 12 system  review was remarkable for seizures  Past Medical History  Diagnosis Date  . Hypertension   . Diabetes mellitus without complication   . Seizures   . Congenital hydrocephalus     has VP shunt  . Insomnia   . Agenesis of corpus callosum   . Intellectual delay    Hospitalizations: No., Head Injury: No., Nervous System Infections: No., Immunizations up to date: Yes.   Past Medical History Comments: ER visit December 2015 due to seizure activity.  Surgical History Past Surgical History  Procedure Laterality Date  . Shunt replacement  2011    Was readmitted 1 week later with peritoneal infection from shunt  . Wisdom tooth extraction      Family History family history includes Cancer in her maternal grandmother and paternal grandfather; Diabetes in her father, maternal grandmother, and maternal uncle; Heart disease in her maternal grandfather; Hypertension in her father and mother; Thyroid disease in her maternal grandmother. Family History is otherwise negative for migraines, seizures, cognitive impairment, blindness, deafness, birth defects, chromosomal disorder, autism.  Social History History   Social History  . Marital Status: Single    Spouse Name: N/A  . Number of Children: N/A  . Years of Education: N/A   Social History Main Topics  . Smoking status: Never Smoker   . Smokeless tobacco: Never Used  . Alcohol Use: No  . Drug Use: No  . Sexual Activity: No   Other Topics Concern  . None   Social History Narrative   Lives with mother.   Denies pets.   Denies smoke exposure   Denies tobacco, drugs, or alcohol use   Hobbies: Mostly listens to music, does lots of writing, activity  books, goes shopping   Went to [employer] until 29 years old.   Brushes own teeth; mom helps her bathe; fixes own drinks; mom prepares meals - takes care of self and mom helps.   Denies sexual relationship.   Denies concern for STDs.   Caffeine none         Educational level:  special education School Attending: N/A Living with:  mother  Hobbies/Interest: Enjoys walking, singing and coloring.  School comments:  Katelyn LackJeelisha completed her education at [employer] in 2012.  Physical Exam BP 126/80 mmHg  Pulse 94  Ht 5' 2.5" (1.588 m)  Wt 152 lb 12.8 oz (69.31 kg)  BMI 27.48 kg/m2  LMP 02/23/2014 (Exact Date) General: well developed, well nourished female, seated in exam room, in no evident distress. She is quiet but attentive.  Head: She has a misshapen head. She has oxycephaly, plagiocephaly, and megencephaly. She has a VP shunt and esotropia.  Neck: supple with no carotid or supraclavicular bruits.  Respiratory: lungs clear to auscultation  Cardiovascular: regular rate and rhythm, no murmurs   Neurologic Exam  Mental Status: Awake and fully alert. She has mental retardation. She is able to answer some questions but is unable to volunteer information.  Cranial Nerves: Fundoscopic exam reveals sharp disc margins. Pupils equal, briskly reactive to light. She has esotropia. Visual fields appear full to confrontation. Hearing intact and symmetric to whisper. Facial sensation intact. Face, tongue, palate move normally and symmetrically. Neck flexion and extension normal.  Motor: Normal bulk and tone. Normal strength in all tested extremity muscles.  Sensory: Intact to touch and temperature in all extremities.  Coordination: Rapid alternating movements slowed in all extremities. Finger-to-nose and heel-to-shin performed accurately bilaterally.Romberg negative.  Gait and Station: Arises from chair without difficulty. Stance is wide based. Gait demonstrates normal stride length and balance. Able to heel and toe walk fairly well. She could not perform a tandem walk without ataxia.  Reflexes: Diminished and symmetric. Toes downgoing.   Assessment and Plan Katelyn LackJeelisha is a 29 year old woman with seizure disorder, complex cranial malformation with dorsal  third ventricular cyst, agenesis of the corpus callosum, oxycephaly, and plagiocephaly requiring ventriculoperitoneal shunt. 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 She has focal motor seizures and intellectual delay. Her seizures had been under fairly good control until December 2015, when she had a 15 minute convulsive seizure and was started on [name]. This controlled her seizures until she ran out of medication in January, then the seizures started again. Dr Sharene SkeansHickling was consulted and came in to talk to [facility] mother. We will restart the [name] and Mom was instructed to call if Katelyn LackJeelisha had more seizures or needed refills. I explained to her to not stop the medication because the bottle was empty, but to call an request a refill. I will see Katelyn LackJeelisha back in follow up in 6 months or sooner if needed. Mom agreed with these plans.

## 2014-03-05 NOTE — Telephone Encounter (Signed)
LMTCB. Will try again later. Reyonna Haack, CMA. 

## 2014-03-06 NOTE — Patient Instructions (Signed)
I have sent in a prescription electronically for [name] 500mg  - 1 tablet twice per day. This is the medication that was started in the hospital in December for Katelyn [name]'s seizures.  Call the office if Katelyn [name] has any more seizures, as we may need to adjust the dose.  Continue her other medications without change.  Please plan to return for follow up in 6 months or sooner if needed.

## 2014-03-10 MED ORDER — NORGESTIMATE-ETH ESTRADIOL 0.25-35 MG-MCG PO TABS: 1.0000 | ORAL_TABLET | Freq: Every day | ORAL | Status: DC

## 2014-03-10 NOTE — Telephone Encounter (Signed)
Spoke with [facility] and [facility] [address].  Rx resent. Olean Sangster,CMA

## 2014-03-10 NOTE — Addendum Note (Signed)
Addended by: Henri MedalHARTSELL, JAZMIN M on: 03/10/2014 09:46 AM   Modules accepted: Orders

## 2014-04-06 ENCOUNTER — Ambulatory Visit (INDEPENDENT_AMBULATORY_CARE_PROVIDER_SITE_OTHER): Payer: Medicare Other | Admitting: Neurology

## 2014-04-06 DIAGNOSIS — G40409 Other generalized epilepsy and epileptic syndromes, not intractable, without status epilepticus: Secondary | ICD-10-CM

## 2014-04-06 DIAGNOSIS — Q02 Microcephaly: Secondary | ICD-10-CM

## 2014-04-06 DIAGNOSIS — IMO0002 Reserved for concepts with insufficient information to code with codable children: Secondary | ICD-10-CM

## 2014-04-06 DIAGNOSIS — G40309 Generalized idiopathic epilepsy and epileptic syndromes, not intractable, without status epilepticus: Secondary | ICD-10-CM

## 2014-04-06 DIAGNOSIS — G478 Other sleep disorders: Secondary | ICD-10-CM

## 2014-04-06 DIAGNOSIS — R351 Nocturia: Secondary | ICD-10-CM

## 2014-04-06 DIAGNOSIS — G4701 Insomnia due to medical condition: Secondary | ICD-10-CM

## 2014-04-07 NOTE — Sleep Study (Signed)
See scanned documents in Encounters tab 

## 2014-04-11 ENCOUNTER — Other Ambulatory Visit: Payer: Self-pay | Admitting: Family Medicine

## 2014-04-14 ENCOUNTER — Other Ambulatory Visit: Payer: Self-pay | Admitting: *Deleted

## 2014-04-14 DIAGNOSIS — R7303 Prediabetes: Secondary | ICD-10-CM

## 2014-04-14 MED ORDER — METFORMIN HCL 1000 MG PO TABS: 1000.0000 mg | ORAL_TABLET | Freq: Two times a day (BID) | ORAL | Status: DC

## 2014-04-16 ENCOUNTER — Other Ambulatory Visit: Payer: Medicare Other

## 2014-04-16 ENCOUNTER — Ambulatory Visit (INDEPENDENT_AMBULATORY_CARE_PROVIDER_SITE_OTHER): Payer: Medicare Other | Admitting: *Deleted

## 2014-04-16 VITALS — BP 148/70 | HR 103

## 2014-04-16 DIAGNOSIS — I10 Essential (primary) hypertension: Secondary | ICD-10-CM

## 2014-04-16 DIAGNOSIS — Z136 Encounter for screening for cardiovascular disorders: Secondary | ICD-10-CM

## 2014-04-16 DIAGNOSIS — Z0130 Encounter for examination of blood pressure without abnormal findings: Secondary | ICD-10-CM

## 2014-04-16 LAB — BASIC METABOLIC PANEL
BUN: 9 mg/dL (ref 6–23)
CO2: 27 meq/L (ref 19–32)
CREATININE: 0.57 mg/dL (ref 0.50–1.10)
Calcium: 9.5 mg/dL (ref 8.4–10.5)
Chloride: 103 mEq/L (ref 96–112)
Glucose, Bld: 135 mg/dL — ABNORMAL HIGH (ref 70–99)
POTASSIUM: 3.8 meq/L (ref 3.5–5.3)
SODIUM: 142 meq/L (ref 135–145)

## 2014-04-16 NOTE — Progress Notes (Signed)
[facility] phlebotomist drew:  BMP 

## 2014-04-16 NOTE — Progress Notes (Signed)
   Pt in nurse clinic for blood pressure check.  BP 148/70, heart rate 103.  Pt denies any symptoms.  Pt took blood pressure medications as prescribed.  Pt had lab work drawn this AM. Clovis PuMartin, Tamika L, RN

## 2014-04-18 ENCOUNTER — Telehealth: Payer: Self-pay | Admitting: Family Medicine

## 2014-04-18 NOTE — Telephone Encounter (Signed)
Patient with recent nurse visit for blood pressure check, with blood pressure elevated in the 140s systolic. Called mother and she states that patient's blood pressures at home are always 120-130/80s. Her BMET was also normal. She had a sleep study and mom is waiting to hear on results. I discussed with her to follow up in the middle of this month to rediscuss her blood pressure but for now it sounds like she is well-controlled at home and possibly has white coat hypertension. Mom voiced understanding.  Katelyn SingletonMaria T Shyonna Carlin, MD

## 2014-04-24 ENCOUNTER — Encounter: Payer: Self-pay | Admitting: Neurology

## 2014-04-28 ENCOUNTER — Encounter: Payer: Self-pay | Admitting: Family Medicine

## 2014-04-29 ENCOUNTER — Telehealth: Payer: Self-pay | Admitting: *Deleted

## 2014-04-29 DIAGNOSIS — G4700 Insomnia, unspecified: Secondary | ICD-10-CM

## 2014-04-29 NOTE — Telephone Encounter (Signed)
LMOM for mother to call regarding results of sleep study.  Will advise her that PSG showed no sig. osa or limb movement resulting in sleep disruption.  Will also advise that if insomnia continues to be a problem, would suggest a dedicated sleep psychology referral.  Per Dr. Vickey Hugerohmeier, no f/u appt. is needed.  She is willing to discuss results of PSG with the referring provider--tina Goodpasture, NP, and Ellison CarwinWilliam Hickling, MD./fim

## 2014-05-01 NOTE — Telephone Encounter (Signed)
Left message to call back for Sleep Study results.  

## 2014-05-08 ENCOUNTER — Ambulatory Visit: Payer: Medicare Other | Admitting: Family Medicine

## 2014-05-08 NOTE — Telephone Encounter (Signed)
I spoke with the patient's mother and she is aware of the sleep study results. She would like to proceed with referral to sleep psychologist. Can you please put that referral in. Thank you.

## 2014-05-15 ENCOUNTER — Other Ambulatory Visit: Payer: Self-pay | Admitting: Family Medicine

## 2014-05-16 NOTE — Telephone Encounter (Signed)
(   I sent a message to Dr. Vickey Hugerohmeier last week) Please make sure that she puts in this referral for this patient thank you.

## 2014-05-19 NOTE — Telephone Encounter (Signed)
Try the  NPs [facility]. They have offered to take insomnia patients.

## 2014-05-20 NOTE — Addendum Note (Signed)
Addended by: Geronimo RunningINKINS, Forest Redwine A on: 05/20/2014 11:49 AM   Modules accepted: Orders

## 2014-05-27 ENCOUNTER — Ambulatory Visit: Payer: Medicare Other | Admitting: Family Medicine

## 2014-05-27 NOTE — Telephone Encounter (Signed)
I called and spoke to mother of pt.  I relayed that made referral to Dr. Nolen MuMcKinney.  They recommended that pt call and schedule appt. I gave mother the number.

## 2014-05-27 NOTE — Addendum Note (Signed)
Addended byHermenia Fiscal: Maleki Hippe on: 05/27/2014 11:59 AM   Modules accepted: Orders

## 2014-05-27 NOTE — Telephone Encounter (Signed)
Katelyn [name] takes pt for insomnia [phone], [phone]

## 2014-05-27 NOTE — Telephone Encounter (Signed)
Order placed for Dr. Andee PolesParish McKinney (Psychiatry).  Insomnia.

## 2014-06-09 ENCOUNTER — Ambulatory Visit: Payer: Medicare Other | Admitting: Family Medicine

## 2014-06-26 ENCOUNTER — Ambulatory Visit: Payer: Medicare Other | Admitting: Family Medicine

## 2014-08-14 ENCOUNTER — Other Ambulatory Visit: Payer: Self-pay | Admitting: Family Medicine

## 2014-08-15 NOTE — Telephone Encounter (Signed)
Lisinopril refilled. Please have pt make an appt with her PCP in the next 1-2 months.  Joanna Puff, MD [facility] Resident  08/15/2014, 1:50 PM

## 2014-08-27 ENCOUNTER — Ambulatory Visit: Payer: Medicare Other | Admitting: Obstetrics and Gynecology

## 2014-09-08 ENCOUNTER — Ambulatory Visit: Payer: Medicare Other | Admitting: Family

## 2014-09-10 ENCOUNTER — Other Ambulatory Visit: Payer: Self-pay | Admitting: Family Medicine

## 2014-09-14 ENCOUNTER — Other Ambulatory Visit: Payer: Self-pay | Admitting: Family

## 2014-09-29 ENCOUNTER — Encounter: Payer: Self-pay | Admitting: Family

## 2014-09-29 ENCOUNTER — Ambulatory Visit (INDEPENDENT_AMBULATORY_CARE_PROVIDER_SITE_OTHER): Payer: Medicaid Other | Admitting: Family

## 2014-09-29 VITALS — BP 124/76 | HR 86 | Ht 62.75 in | Wt 156.2 lb

## 2014-09-29 DIAGNOSIS — G4700 Insomnia, unspecified: Secondary | ICD-10-CM | POA: Diagnosis not present

## 2014-09-29 DIAGNOSIS — E663 Overweight: Secondary | ICD-10-CM | POA: Diagnosis not present

## 2014-09-29 DIAGNOSIS — Q043 Other reduction deformities of brain: Secondary | ICD-10-CM | POA: Diagnosis not present

## 2014-09-29 DIAGNOSIS — G40319 Generalized idiopathic epilepsy and epileptic syndromes, intractable, without status epilepticus: Secondary | ICD-10-CM

## 2014-09-29 DIAGNOSIS — G40311 Generalized idiopathic epilepsy and epileptic syndromes, intractable, with status epilepticus: Secondary | ICD-10-CM | POA: Diagnosis not present

## 2014-09-29 DIAGNOSIS — G40309 Generalized idiopathic epilepsy and epileptic syndromes, not intractable, without status epilepticus: Secondary | ICD-10-CM

## 2014-09-29 MED ORDER — LEVETIRACETAM 500 MG PO TABS: 500.0000 mg | ORAL_TABLET | Freq: Two times a day (BID) | ORAL | Status: DC

## 2014-09-29 MED ORDER — CARBATROL 300 MG PO CP12: 600.0000 mg | ORAL_CAPSULE | Freq: Two times a day (BID) | ORAL | Status: DC

## 2014-09-29 NOTE — Progress Notes (Signed)
Patient: Katelyn [name] MRN: [id number] Sex: female DOB: Jun 23, 1985  Provider: Elveria Rising, NP Location of Care: [facility]  Note type: Routine return visit  History of Present Illness: History from: her mother Chief Complaint: follow up for seizures  Katelyn [name] is a 29 y.o. 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 She has focal motor seizures, intellectual delay and insomnia. She is taking and tolerating [name] and [name] for her seizure disorder. Katelyn [name] 's last seizure occurred in March 2016. She was last seen March 05, 2014.  Katelyn [name] has had ongoing problems with sleep. She has been evaluated by Dr Golden Hurter and had a sleep study that was normal. Mom said that she was referred to a psychologist for insomnia but that she has been unable to schedule it because of insurance coverage.   [name] is at home with her mother each day. Mom would like to find a day program for her to attend a few days per week but has been unable to locate an affordable one.  Mother has no health concerns today for Katelyn [name] other than previously mentioned.  Review of Systems: Please see the HPI for neurologic and other pertinent review of systems. Otherwise, the following systems are noncontributory including constitutional, eyes, ears, nose and throat, cardiovascular, respiratory, gastrointestinal, genitourinary, musculoskeletal, skin, endocrine, hematologic/lymph, allergic/immunologic and psychiatric.   Past Medical History  Diagnosis Date  . Hypertension   . Diabetes mellitus without  complication   . Seizures   . Congenital hydrocephalus     has VP shunt  . Insomnia   . Agenesis of corpus callosum   . Intellectual delay    Hospitalizations: Yes.  , Head Injury: No., Nervous System Infections: No., Immunizations up to date: Yes.   Past Medical History Comments: See history.  Surgical History Past Surgical History  Procedure Laterality Date  . Shunt replacement  2011    Was readmitted 1 week later with peritoneal infection from shunt  . Wisdom tooth extraction      Family History family history includes Cancer in her maternal grandmother and paternal grandfather; Diabetes in her father, maternal grandmother, and maternal uncle; Heart disease in her maternal grandfather; Hypertension in her father and mother; Thyroid disease in her maternal grandmother. Family History is otherwise negative for migraines, seizures, cognitive impairment, blindness, deafness, birth defects, chromosomal disorder, autism.  Social History Social History   Social History  . Marital Status: Single    Spouse Name: N/A  . Number of Children: N/A  . Years of Education: N/A   Social History Main Topics  . Smoking status: Never Smoker   . Smokeless tobacco: Never Used  . Alcohol Use: No  . Drug Use: No  . Sexual Activity: No   Other Topics Concern  . None   Social History Narrative   Katelyn [name]:   Lives with mother.   Denies pets.   Denies smoke exposure   Denies tobacco, drugs, or alcohol use   Hobbies: Mostly listens to music, does lots of writing, activity books, goes shopping   Went to [employer] until 29 years old.   Brushes own teeth; mom helps her bathe; fixes own drinks; mom prepares meals - takes care of self and  mom helps.   Denies sexual relationship.   Denies concern for STDs.   Caffeine none          Allergies No Known Allergies  Physical Exam BP 124/76 mmHg  Pulse 86  Ht 5' 2.75" (1.594 m)  Wt 156 lb 3.2 oz (70.852 kg)  BMI 27.89 kg/m2  LMP  09/18/2014 (Exact Date) General: well developed, well nourished female, seated in exam room, in no evident distress. She is quiet but attentive.  Head: She has a misshapen head. She has oxycephaly, plagiocephaly, and megencephaly. She has a VP shunt and esotropia.  Neck: supple with no carotid or supraclavicular bruits.  Respiratory: lungs clear to auscultation  Cardiovascular: regular rate and rhythm, no murmurs   Neurologic Exam  Mental Status: Awake and fully alert. She has mental retardation. She is able to answer some questions but is unable to volunteer information.  Cranial Nerves: Fundoscopic exam reveals sharp disc margins. Pupils equal, briskly reactive to light. She has esotropia. Visual fields appear full to confrontation. Hearing intact and symmetric to whisper. Facial sensation intact. Face, tongue, palate move normally and symmetrically. Neck flexion and extension normal.  Motor: Normal bulk and tone. Normal strength in all tested extremity muscles.  Sensory: Intact to touch and temperature in all extremities.  Coordination: Rapid alternating movements slowed in all extremities. Finger-to-nose and heel-to-shin performed accurately bilaterally.Romberg negative.  Gait and Station: Arises from chair without difficulty. Stance is wide based. Gait demonstrates normal stride length and balance. Able to heel and toe walk fairly well. She could not perform a tandem walk without ataxia.  Reflexes: Diminished and symmetric. Toes downgoing.   Impression 1. Generalized convulsive seizures 2. Focal motor seizures 3. 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 Ventriculoperitoneal shunt 5. Intellectual delay   Recommendations for plan of  care The patient's previous [facility] records were reviewed. Bekah has neither had nor required imaging or lab studies since the last visit. 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 She has generalized convulsive and focal motor seizures and intellectual delay. She has remained seizure free since March 2016 on [name] and [name],and will continue these medications without change. I told Mom that I will see if I can find out where she can be seen by a psychologist for insomnia and let her know. I will see Dilcia back in follow up in 6 months or sooner if needed. Mom agreed with these plans  The medication list was reviewed and reconciled.  No changes were made in the prescribed medications today.  A complete medication list was provided to the patient's mother.  Total time spent with the patient was 30 minutes, of which 50% or more was spent in counseling and coordination of care.

## 2014-09-29 NOTE — Patient Instructions (Signed)
Continue giving Katelyn [name]'s medication as you have been giving it. Let me know if she has any seizures.   I will contact Dr Dohmeier's office about the referral for insomnia and ask them to call you back.   Katelyn [name]'s weight is stable. Try to help her continue to maintain her weight loss.   Please plan to return for follow up in 6 months or sooner if needed.

## 2014-10-10 ENCOUNTER — Encounter: Payer: Self-pay | Admitting: Obstetrics and Gynecology

## 2014-10-10 ENCOUNTER — Ambulatory Visit (INDEPENDENT_AMBULATORY_CARE_PROVIDER_SITE_OTHER): Payer: Medicaid Other | Admitting: Obstetrics and Gynecology

## 2014-10-10 VITALS — BP 124/84 | HR 100 | Temp 99.7°F | Ht 63.0 in | Wt 158.0 lb

## 2014-10-10 DIAGNOSIS — Z0000 Encounter for general adult medical examination without abnormal findings: Secondary | ICD-10-CM | POA: Diagnosis not present

## 2014-10-10 DIAGNOSIS — R7309 Other abnormal glucose: Secondary | ICD-10-CM | POA: Diagnosis not present

## 2014-10-10 DIAGNOSIS — G40909 Epilepsy, unspecified, not intractable, without status epilepticus: Secondary | ICD-10-CM

## 2014-10-10 DIAGNOSIS — R7303 Prediabetes: Secondary | ICD-10-CM

## 2014-10-10 DIAGNOSIS — I10 Essential (primary) hypertension: Secondary | ICD-10-CM | POA: Diagnosis not present

## 2014-10-10 DIAGNOSIS — Z23 Encounter for immunization: Secondary | ICD-10-CM

## 2014-10-10 LAB — POCT GLYCOSYLATED HEMOGLOBIN (HGB A1C): Hemoglobin A1C: 5.6

## 2014-10-10 MED ORDER — METFORMIN HCL 1000 MG PO TABS: ORAL_TABLET | ORAL | Status: DC

## 2014-10-10 MED ORDER — LISINOPRIL 10 MG PO TABS: 10.0000 mg | ORAL_TABLET | Freq: Every day | ORAL | Status: DC

## 2014-10-10 NOTE — Patient Instructions (Signed)
It was great to meet you today.  I am pleased to hear that things are going well for you.  Here are some of the things we discussed today: -Continue to monitor blood pressure and sugars at home at least once a week -No changes to medications at this time -Refills given -You received your flu vaccine today.   Please schedule a follow-up appointment for 6 months or sooner as needed   Thanks for allowing me to be a part of your care! Dr. Doroteo Glassman

## 2014-10-10 NOTE — Assessment & Plan Note (Addendum)
BP initially elevated in clinic but on recheck was wnl. Patient with well controlled pressures at home as well. Continue current regimen. Refills given.

## 2014-10-10 NOTE — Assessment & Plan Note (Addendum)
A1c today 5.6. Has been stable in this range over last several checks. Continue metformin. On next A1c check consider adjusting medications.

## 2014-10-10 NOTE — Progress Notes (Signed)
    Subjective: Chief Complaint  Patient presents with  . Diabetes  . Hypertension    HPI: Katelyn [name] is a 29 y.o. presenting to clinic today to discuss the following:  #Diabetes:  Sugars - Usually around 110; only high with seizures  Taking medications: Yes just metformin Side effects: none Last eye exam: January this year ROS: denies fever, chills, dizziness, LOC, polyuria, polydipsia, numbness or tingling in extremities or chest pain, visual disturbances.  #Hypertension: Blood pressure at home: 115-120/ 80-90 Blood pressure today: normal Taking Meds: Yes, Lisinopril, metoprolol Side effects: None ROS: Denies headache, dizziness, visual changes, nausea, vomiting, chest pain, abdominal pain or shortness of breath.  #Seizure disorder: -Follows with neurology -has not had a seizure in over 7 months -usually trigger would be menstrual cycle but not problematic anymore -was started on Keppra with no side effects and no more seizures  Health Maintenance: Due for flu vaccine    ROS in HPI.  Past Medical, Surgical, Social, and Family History Reviewed & Updated per EMR.   Objective: BP 124/84 mmHg  Pulse 100  Temp(Src) 99.7 F (37.6 C) (Oral)  Ht  (1.6 m)  Wt 158 lb (71.668 kg)  BMI 28.00 kg/m2  LMP 09/18/2014 (Exact Date)  Physical Exam  Constitutional: She is well-developed, well-nourished, and in no distress.  HENT:  She has oxycephaly and plagiocephaly.  Eyes:  Esotropia  Cardiovascular: Normal rate, regular rhythm, normal heart sounds and intact distal pulses.   Pulmonary/Chest: Effort normal and breath sounds normal.  Abdominal: Soft. Bowel sounds are normal. There is no tenderness.  Musculoskeletal: Normal range of motion.  Neurological: She is alert.  Skin: Skin is warm and dry.   Assessment/Plan: HTN (hypertension) BP initially elevated in clinic but on recheck was wnl. Patient with well controlled pressures at home as well. Continue current  regimen. Refills given.   Pre-diabetes A1c today 5.6. Has been stable in this range over last several checks. Continue metformin. On next A1c check consider adjusting medications.   Seizure disorder Seizure disorder stable. Last seizure 7 months ago. Followed with neurology. Continue medication regimen that includes Keppra and [name].  Routine adult health maintenance Flu vaccine received today.    Orders Placed This Encounter  Procedures  . Flu Vaccine QUAD 36+ mos IM  . POCT A1C    Meds ordered this encounter  Medications  . lisinopril (PRINIVIL,ZESTRIL) 10 MG tablet    Sig: Take 1 tablet (10 mg total) by mouth daily.    Dispense:  90 tablet    Refill:  2  . metFORMIN (GLUCOPHAGE) 1000 MG tablet    Sig: TAKE 1 TABLET(1000 MG) BY MOUTH TWICE DAILY WITH A MEAL    Dispense:  180 tablet    Refill:  3    Caryl Ada, DO PGY-2, [facility]

## 2014-10-13 NOTE — Assessment & Plan Note (Signed)
Seizure disorder stable. Last seizure 7 months ago. Followed with neurology. Continue medication regimen that includes Keppra and [name].

## 2014-10-13 NOTE — Assessment & Plan Note (Signed)
Flu vaccine received today 

## 2015-04-20 ENCOUNTER — Other Ambulatory Visit: Payer: Self-pay | Admitting: Family

## 2015-04-27 ENCOUNTER — Other Ambulatory Visit: Payer: Self-pay | Admitting: Family Medicine

## 2015-04-27 ENCOUNTER — Other Ambulatory Visit: Payer: Self-pay | Admitting: Family

## 2015-05-11 ENCOUNTER — Ambulatory Visit: Payer: Medicaid Other | Admitting: Family

## 2015-05-19 ENCOUNTER — Encounter: Payer: Medicaid Other | Admitting: Family Medicine

## 2015-05-21 ENCOUNTER — Ambulatory Visit: Payer: Medicaid Other | Admitting: Family

## 2015-05-29 ENCOUNTER — Encounter: Payer: Self-pay | Admitting: Family Medicine

## 2015-05-29 ENCOUNTER — Ambulatory Visit (INDEPENDENT_AMBULATORY_CARE_PROVIDER_SITE_OTHER): Payer: Medicaid Other | Admitting: Family Medicine

## 2015-05-29 VITALS — BP 147/101 | HR 107 | Temp 98.5°F | Ht 63.5 in | Wt 162.0 lb

## 2015-05-29 DIAGNOSIS — Z114 Encounter for screening for human immunodeficiency virus [HIV]: Secondary | ICD-10-CM | POA: Diagnosis not present

## 2015-05-29 DIAGNOSIS — Z0000 Encounter for general adult medical examination without abnormal findings: Secondary | ICD-10-CM

## 2015-05-29 DIAGNOSIS — IMO0001 Reserved for inherently not codable concepts without codable children: Secondary | ICD-10-CM

## 2015-05-29 LAB — HIV ANTIBODY (ROUTINE TESTING W REFLEX): HIV: NONREACTIVE

## 2015-05-29 NOTE — Patient Instructions (Signed)
Pap Test WHY AM I HAVING THIS TEST? A pap test is sometimes called a pap smear. It is a screening test that is used to check for signs of cancer of the vagina, cervix, and uterus. The test can also identify the presence of infection or precancerous changes. Your health care provider will likely recommend you have this test done on a regular basis. This test may be done:  Every 3 years, starting at age 29.  Every 5 years, in combination with testing for the presence of human papillomavirus (HPV).  More or less often depending on other medical conditions.  WHAT KIND OF SAMPLE IS TAKEN? Using a small cotton swab, plastic spatula, or brush, your health care provider will collect a sample of cells from the surface of your cervix. Your cervix is the opening to your uterus, also called a womb. Secretions from the cervix and vagina may also be collected. HOW DO I PREPARE FOR THE TEST?  Be aware of where you are in your menstrual cycle. You may be asked to reschedule the test if you are menstruating on the day of the test.  You may need to reschedule if you have a known vaginal infection on the day of the test.  You may be asked to avoid douching or taking a bath the day before or the day of the test.  Some medicines can cause abnormal test results, such as digitalis and tetracycline. Talk with your health care provider before your test if you take one of these medicines. WHAT DO THE RESULTS MEAN? Abnormal test results may indicate a number of health conditions. These may include:  Cancer. Although pap test results cannot be used to diagnose cancer of the cervix, vagina, or uterus, they may suggest the possibility of cancer. Further tests would be required to determine if cancer is present.  Sexually transmitted disease.  Fungal infection.  Parasite infection.  Herpes infection.  A condition causing or contributing to infertility. It is your responsibility to obtain your test results. Ask  the lab or department performing the test when and how you will get your results. Contact your health care provider to discuss any questions you have about your results.   This information is not intended to replace advice given to you by your health care provider. Make sure you discuss any questions you have with your health care provider.   Document Released: 03/26/2002 Document Revised: 01/24/2014 Document Reviewed: 05/27/2013 Elsevier Interactive Patient Education 2016 Elsevier Inc.  

## 2015-05-29 NOTE — Progress Notes (Addendum)
Patient ID: Katelyn [name], female   DOB: 1986-01-01, 30 y.o.   MRN: [id number] Subjective:     Katelyn [name] is a 30 y.o. female and is here for a comprehensive physical exam. The patient reports no problems. Her mother however stated there is a bump on the right side of her jaw for 2 wks. No change in size.  LMP: 05/02/15 Shx: Never been active. Mom stated they had Fhx of ovarian cancer in her grand aunt and great grand mother. Her mother has no cancer. Gyn exam: Per her mother, her last PAP was in 2014 and it was painful and she bled for a while after the procedure.  Social History   Social History  . Marital Status: Single    Spouse Name: N/A  . Number of Children: N/A  . Years of Education: N/A   Occupational History  . Not on file.   Social History Main Topics  . Smoking status: Never Smoker   . Smokeless tobacco: Never Used  . Alcohol Use: No  . Drug Use: No  . Sexual Activity: No   Other Topics Concern  . Not on file   Social History Narrative   [name]:   Lives with mother.   Denies pets.   Denies smoke exposure   Denies tobacco, drugs, or alcohol use   Hobbies: Mostly listens to music, does lots of writing, activity books, goes shopping   Went to [employer] until 30 years old.   Brushes own teeth; mom helps her bathe; fixes own drinks; mom prepares meals - takes care of self and mom helps.   Denies sexual relationship.   Denies concern for STDs.   Caffeine none         Health Maintenance  Topic Date Due  . HIV Screening  07/10/2001  . FOOT EXAM  05/16/2013  . OPHTHALMOLOGY EXAM  01/21/2015  . HEMOGLOBIN A1C  04/09/2015  . PAP SMEAR  04/20/2015  . INFLUENZA VACCINE  08/18/2015  . PNEUMOCOCCAL POLYSACCHARIDE VACCINE (2) 10/26/2017  . TETANUS/TDAP  06/02/2022    The following portions of the patient's history were reviewed and updated as appropriate: allergies, current medications, past family history, past medical history, past social history, past  surgical history and problem list.  Review of Systems Pertinent items are noted in HPI.   Objective:    BP 147/101 mmHg  Pulse 107  Temp(Src) 98.5 F (36.9 C) (Oral)  Ht 5' 3.5" (1.613 m)  Wt 162 lb (73.483 kg)  BMI 28.24 kg/m2  SpO2 100%  LMP 05/02/2015 General appearance: alert and cooperative Head: asymmetric shape, atraumatic, Scar on the right side of her jaw,linear, raised, non-tender, non-erythematous, feels like keloid Eyes: conjunctivae/corneas clear. PERRL, EOM's intact. Fundi benign. Ears: normal TM's and external ear canals both ears and some wax present in her ears B/L Throat: lips, mucosa, and tongue normal; teeth and gums normal Neck: no adenopathy, no carotid bruit, no JVD, supple, symmetrical, trachea midline and thyroid not enlarged, symmetric, no tenderness/mass/nodules Lungs: clear to auscultation bilaterally Heart: regular rate and rhythm, S1, S2 normal, no murmur, click, rub or gallop Abdomen: soft, non-tender; bowel sounds normal; no masses,  no organomegaly Pelvic: PAP/pelvic exam offered. Mom and patient declined. Extremities: extremities normal, atraumatic, no cyanosis or edema Skin: Skin color, texture, turgor normal. No rashes or lesions Neurologic: Alert and oriented X 3, normal strength and tone. Normal symmetric reflexes. Normal coordination and gait      Assessment:    Healthy female exam.  Plan:   Well exam normal. PAP discussed today. Patient has never been sexually active however she has family hx of cancer. As discussed with patient and her mother, FHx of cancer is an indication for PAP although she has never been sexually active. Patient and mom prevent not to go through the potential traumatic insertion of speculum. Given previous experience. Advised to f/u soon with PCP if she has any GU concern or decide to have PAP in the future. Patient and her mother verbalized understanding. HIV screening offered and done today.  Mom and  patient reassured that the bump on the right side of her jaw is likely keloid formation. She is currently asymptomatic. We will monitor for now. They both verbalized understanding and agreed with plan.

## 2015-06-01 ENCOUNTER — Ambulatory Visit (INDEPENDENT_AMBULATORY_CARE_PROVIDER_SITE_OTHER): Payer: Medicaid Other | Admitting: Family

## 2015-06-01 ENCOUNTER — Encounter: Payer: Self-pay | Admitting: Family

## 2015-06-01 ENCOUNTER — Telehealth: Payer: Self-pay | Admitting: Family Medicine

## 2015-06-01 VITALS — BP 120/70 | HR 84 | Ht 63.25 in | Wt 162.8 lb

## 2015-06-01 DIAGNOSIS — G4700 Insomnia, unspecified: Secondary | ICD-10-CM

## 2015-06-01 DIAGNOSIS — G40319 Generalized idiopathic epilepsy and epileptic syndromes, intractable, without status epilepticus: Secondary | ICD-10-CM | POA: Diagnosis not present

## 2015-06-01 DIAGNOSIS — Q039 Congenital hydrocephalus, unspecified: Secondary | ICD-10-CM

## 2015-06-01 DIAGNOSIS — Q043 Other reduction deformities of brain: Secondary | ICD-10-CM | POA: Diagnosis not present

## 2015-06-01 NOTE — Progress Notes (Signed)
Patient: Katelyn [name] MRN: [id number] Sex: female DOB: 07-10-1985  Provider: Elveria Risingina Rajvi Armentor, NP Location of Care: [facility]  Note type: Routine return visit  History of Present Illness: Referral Source: Pincus LargeJazma Y Phelps, DO History from: patient, referring office, [facility] chart and mother Chief Complaint: Epilepsy  Katelyn FusiJeelisha [name] is a 30 y.o. 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 She has focal motor seizures, intellectual delay and insomnia. She is taking and tolerating [name] and [name] for her seizure disorder. Katelyn [name] 's last seizure occurred in March 2016. She was last seen September 29, 2014.  Katelyn [name] has had ongoing problems with sleep. She has been evaluated by Dr Golden Hurterohemier and had a sleep study that was normal. Mom said that she Katelyn [name] has been sleeping better lately. She recently spent several months with her father in [place]. He has been quite ill and Mom said that [name] stayed busy and occupied during the day because of her father's needs. She was tired at night and was able to go to sleep. Mom said since returning from [place] that she has stayed on the routine of going to bed at the same time each night and that she has had less insomnia since returning home.   Katelyn [name] is at home with her mother each day. Mom would like to find a day program for her to attend a few days per week but has been unable to locate an affordable one. There are no problems with behavior.  Mother has no health concerns today for Katelyn [name] other than previously  mentioned.  Review of Systems: Please see the HPI for neurologic and other pertinent review of systems. Otherwise, the following systems are noncontributory including constitutional, eyes, ears, nose and throat, cardiovascular, respiratory, gastrointestinal, genitourinary, musculoskeletal, skin, endocrine, hematologic/lymph, allergic/immunologic and psychiatric.   Past Medical History  Diagnosis Date  . Hypertension   . Diabetes mellitus without complication (HCC)   . Seizures (HCC)   . Congenital hydrocephalus (HCC)     has VP shunt  . Insomnia   . Agenesis of corpus callosum (HCC)   . Intellectual delay    Hospitalizations: No., Head Injury: No., Nervous System Infections: No., Immunizations up to date: Yes.   Past Medical History Comments: See history.   Surgical History Past Surgical History  Procedure Laterality Date  . Shunt replacement  2011    Was readmitted 1 week later with peritoneal infection from shunt  . Wisdom tooth extraction      Family History family history includes Cancer in her maternal grandmother and paternal grandfather; Diabetes in her father, maternal grandmother, and maternal uncle; Heart disease in her maternal grandfather; Hypertension in her father and mother; Thyroid disease in her maternal grandmother. Family History is otherwise negative for migraines, seizures, cognitive impairment, blindness, deafness, birth defects, chromosomal disorder, autism.  Social History Social History   Social History  . Marital Status: Single    Spouse Name: N/A  . Number of Children: N/A  . Years of Education: N/A   Social History Main Topics  . Smoking status: Never Smoker   . Smokeless tobacco: Never Used  . Alcohol Use: No  . Drug Use: No  . Sexual Activity: No  Other Topics Concern  . None   Social History Narrative   Katelyn [name]:   Lives with mother.   Denies pets.   Denies smoke exposure   Denies tobacco, drugs, or alcohol use   Hobbies: Mostly  listens to music, does lots of writing, activity books, goes shopping   Went to [employer] until 30 years old.   Brushes own teeth; mom helps her bathe; fixes own drinks; mom prepares meals - takes care of self and mom helps.   Denies sexual relationship.   Denies concern for STDs.   Caffeine none          Allergies No Known Allergies  Physical Exam BP 120/70 mmHg  Pulse 84  Ht 5' 3.25" (1.607 m)  Wt 162 lb 12.8 oz (73.846 kg)  BMI 28.60 kg/m2  LMP 05/31/2015 (Exact Date) General: well developed, well nourished female, seated in exam room, in no evident distress. She is quiet but attentive.  Head: She has a misshapen head. She has oxycephaly, plagiocephaly, and megencephaly. She has a VP shunt and esotropia.  Neck: supple with no carotid or supraclavicular bruits.  Respiratory: lungs clear to auscultation  Cardiovascular: regular rate and rhythm, no murmurs   Neurologic Exam  Mental Status: Awake and fully alert. She has mental retardation. She is able to answer some questions but is unable to volunteer information.  Cranial Nerves: Fundoscopic exam reveals sharp disc margins. Pupils equal, briskly reactive to light. She has esotropia. Visual fields appear full to confrontation. Hearing intact and symmetric to whisper. Facial sensation intact. Face, tongue, palate move normally and symmetrically. Neck flexion and extension normal.  Motor: Normal bulk and tone. Normal strength in all tested extremity muscles.  Sensory: Intact to touch and temperature in all extremities.  Coordination: Rapid alternating movements slowed in all extremities. Finger-to-nose and heel-to-shin performed accurately bilaterally.Romberg negative.  Gait and Station: Arises from chair without difficulty. Stance is wide based. Gait demonstrates normal stride length and balance. Able to heel and toe walk fairly well. She could not perform a tandem walk without ataxia.  Reflexes: Diminished and  symmetric. Toes downgoing.   Impression 1. Generalized convulsive seizures 2. Focal motor seizures 3. 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 Ventriculoperitoneal shunt 5. Intellectual delay   Recommendations for plan of care The patient's previous [facility] records were reviewed. Katelyn [name] has neither had nor required imaging or lab studies since the last visit. 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 She has generalized convulsive and focal motor seizures and intellectual delay. She has remained seizure free since March 2016 on [name] and [name],and will continue these medications without change. Katelyn [name] had had problems with insomnia but that has improved since a trip to [place]. I encouraged her mother to try to keep Katelyn [name] on the same sleep schedule as she had in [place] and explained about sleep hygiene measures.  I will see Katelyn [name] back in follow up in 6 months or sooner if needed. Mom agreed with these plans  The medication list was reviewed and reconciled.  No changes were made in the prescribed medications today.  A complete medication list was provided to the patient's mother.    Medication List       This list is accurate as of: 06/01/15 11:59 PM.  Always use your most recent med list.               [name] 300  MG 12 hr capsule  Generic drug:  carbamazepine  TAKE 2 CAPSULES BY MOUTH TWICE DAILY     [name] 500 MG tablet  Commonly known as:  KEPPRA  TAKE 1 TABLET(500 MG) BY MOUTH TWICE DAILY     lisinopril 10 MG tablet  Commonly known as:  PRINIVIL,ZESTRIL  Take 1 tablet (10 mg total) by mouth daily.     metFORMIN 1000 MG tablet  Commonly known as:  GLUCOPHAGE  TAKE 1 TABLET(1000 MG) BY MOUTH TWICE DAILY WITH A MEAL     metoprolol succinate 100 MG 24 hr tablet  Commonly known as:  TOPROL-XL  TAKE 1 TABLET BY MOUTH DAILY WITH FOOD     onetouch ultrasoft lancets  Use as instructed       Dr. Sharene Skeans was consulted regarding the patient.   Total time spent with the patient was 30 minutes, of which 50% or more was spent in counseling and coordination of care.   Elveria Rising

## 2015-06-01 NOTE — Patient Instructions (Signed)
I am pleased that Katelyn [name] is doing well at this time.   Let me know if she has any seizures or if you have any other concerns.   Please plan to return for follow up in 6 months or sooner if needed.

## 2015-06-01 NOTE — Telephone Encounter (Signed)
Neg HIV result discussed with her mom.

## 2015-07-20 ENCOUNTER — Ambulatory Visit (INDEPENDENT_AMBULATORY_CARE_PROVIDER_SITE_OTHER): Payer: Medicaid Other | Admitting: Obstetrics and Gynecology

## 2015-07-20 ENCOUNTER — Encounter: Payer: Self-pay | Admitting: Obstetrics and Gynecology

## 2015-07-20 VITALS — BP 140/97 | HR 91 | Temp 97.9°F | Wt 163.0 lb

## 2015-07-20 DIAGNOSIS — E119 Type 2 diabetes mellitus without complications: Secondary | ICD-10-CM | POA: Diagnosis not present

## 2015-07-20 DIAGNOSIS — I10 Essential (primary) hypertension: Secondary | ICD-10-CM | POA: Diagnosis not present

## 2015-07-20 DIAGNOSIS — G40909 Epilepsy, unspecified, not intractable, without status epilepticus: Secondary | ICD-10-CM

## 2015-07-20 DIAGNOSIS — L910 Hypertrophic scar: Secondary | ICD-10-CM | POA: Diagnosis not present

## 2015-07-20 MED ORDER — METFORMIN HCL 1000 MG PO TABS: 1000.0000 mg | ORAL_TABLET | Freq: Every day | ORAL | Status: DC

## 2015-07-20 MED ORDER — KP SILICONE SCAR THERAPY GEL STRP: ORAL_STRIP | Status: DC

## 2015-07-20 MED ORDER — LISINOPRIL 20 MG PO TABS: 20.0000 mg | ORAL_TABLET | Freq: Every day | ORAL | Status: DC

## 2015-07-20 NOTE — Progress Notes (Signed)
     Subjective: Chief Complaint  Patient presents with  . Follow-up    BP     HPI: Katelyn [name] is a 30 y.o. presenting to clinic today to discuss the following:  #Hypertension Blood pressure at home: elevated at home; mother checks it about twice a day Blood pressure today: elevated Taking Meds: yes Side effects: none ROS: Denies headache, dizziness, visual changes, nausea, vomiting, chest pain, abdominal pain or shortness of breath.  #Keloid Has scar on her face that has now become a keloid Does not know what happened Mother states she was over her father's house when it happened Occurred in May Mother wants to know if there is any treatment to reduce appearance  #Diabetes  Last A1cs <6.0; however in 2014 had a A1c of 7.0 Taking medications: yes just metformin Side effects: no hyoglycemia ROS: denies fever, chills, dizziness, LOC, polyuria, polydipsia, numbness or tingling in extremities or chest pain, visual disturbances.  #Epilepsy: Last seizure was in April of this year Usually precipitated by starting her menstrual cycle Has not needed any hospitalizations this year Seizures have not been grand mal and able to be managed at home Follows with neuro   ROS noted in HPI.  Past Medical, Surgical, Social, and Family History Reviewed & Updated per EMR. Smoking status - Never Smoker   Objective: BP 140/97 mmHg  Pulse 91  Temp(Src) 97.9 F (36.6 C) (Oral)  Wt 163 lb (73.936 kg)  SpO2 100%  LMP 07/02/2015 Vitals and nursing notes reviewed  Physical Exam  Constitutional: She is oriented to person, place, and time and well-developed, well-nourished, and in no distress.  HENT:  Oxycephaly and plagiocephaly. Right side of face with 1cm keloid scar on jaw.  Eyes:  Esotropia   Cardiovascular: Normal rate and regular rhythm.   Pulmonary/Chest: Effort normal and breath sounds normal.  Musculoskeletal: Normal range of motion. She exhibits no edema.    Neurological: She is alert and oriented to person, place, and time.  Psychiatric: Mood and affect normal.    Assessment/Plan: Please see problem based Assessment and Plan   Meds ordered this encounter  Medications  . lisinopril (PRINIVIL,ZESTRIL) 20 MG tablet    Sig: Take 1 tablet (20 mg total) by mouth daily.    Dispense:  30 tablet    Refill:  3  . metFORMIN (GLUCOPHAGE) 1000 MG tablet    Sig: Take 1 tablet (1,000 mg total) by mouth daily with breakfast.    Dispense:  180 tablet    Refill:  3  . Occlusive Silicone Strips (KP SILICONE SCAR THERAPY GEL) STRP    Sig: Apply to scar daily. Follow package instructions.    Dispense:  4 strip    Refill:  3     Caryl AdaJazma Jezebelle Ledwell, DO 07/20/2015, 10:33 AM PGY-2, [facility]

## 2015-07-20 NOTE — Patient Instructions (Signed)
meds sent to pharmacy Follow-up in 3 months

## 2015-07-24 DIAGNOSIS — L910 Hypertrophic scar: Secondary | ICD-10-CM | POA: Insufficient documentation

## 2015-07-25 NOTE — Assessment & Plan Note (Signed)
Longstanding chronic condition that is stable. Being managed by neurology. Last seizure was in April and occurred around menstrual cycle which is known trigger. Continue current medications.

## 2015-07-25 NOTE — Assessment & Plan Note (Signed)
1cm keloid on face. Will try silicone gel strips to area to see if can be minimized. If not effective will refer to dermatology for possible intradermal steroidal injections.

## 2015-07-25 NOTE — Assessment & Plan Note (Signed)
Chronic long-standing condition. Pressures elevated in clinic and at home.  -Increase lisinopril to 20mg  -continue metoprolol dose -mother to monitor for hypotension -blood work at next visit

## 2015-07-25 NOTE — Assessment & Plan Note (Addendum)
Controlled. Check A1c at next visit with other blood work. Changed diagnosis on chart from prediabetes to diabetes in light of previous A1c in 2014 being 7.0. Continue metformin at 1000mg  BID; may be able to reduce dose once A1c rechecked. Encouraged follow-up eye exam.

## 2015-09-11 ENCOUNTER — Other Ambulatory Visit: Payer: Self-pay | Admitting: Family Medicine

## 2015-10-29 ENCOUNTER — Other Ambulatory Visit: Payer: Self-pay | Admitting: Obstetrics and Gynecology

## 2015-11-14 ENCOUNTER — Other Ambulatory Visit: Payer: Self-pay | Admitting: Family

## 2015-11-14 ENCOUNTER — Other Ambulatory Visit: Payer: Self-pay | Admitting: Obstetrics and Gynecology

## 2015-11-17 ENCOUNTER — Telehealth (INDEPENDENT_AMBULATORY_CARE_PROVIDER_SITE_OTHER): Payer: Self-pay | Admitting: Family

## 2015-11-17 NOTE — Telephone Encounter (Signed)
Pt cell phone was busy no message left

## 2015-11-17 NOTE — Telephone Encounter (Signed)
-----   Message from Tina Goodpasture, NP sent at 11/16/2015  8:33 AM EDT ----- °Regarding: Needs appointment °[place] neeeds an appointment with me.  °Thanks,  °Tina °

## 2015-11-17 NOTE — Telephone Encounter (Signed)
-----   Message from Elveria Risingina Goodpasture, NP sent at 11/16/2015  8:33 AM EDT ----- Regarding: Needs appointment [name] neeeds an appointment with me.  Thanks,  Inetta Fermoina

## 2015-12-01 ENCOUNTER — Other Ambulatory Visit: Payer: Self-pay | Admitting: Family

## 2015-12-08 NOTE — Telephone Encounter (Signed)
Line busy unable to leave message

## 2015-12-08 NOTE — Telephone Encounter (Signed)
-----   Message from Elveria Risingina Goodpasture, NP sent at 12/01/2015 12:32 PM EST ----- Regarding: Needs appointment [name] needs an appointment with me.  Thanks,  Inetta Fermoina

## 2015-12-14 ENCOUNTER — Other Ambulatory Visit: Payer: Self-pay | Admitting: Obstetrics and Gynecology

## 2015-12-14 NOTE — Telephone Encounter (Signed)
Needs refill on metroprolol.  [facility] at [address]

## 2015-12-23 ENCOUNTER — Encounter: Payer: Self-pay | Admitting: Obstetrics and Gynecology

## 2015-12-23 ENCOUNTER — Ambulatory Visit (INDEPENDENT_AMBULATORY_CARE_PROVIDER_SITE_OTHER): Payer: Medicaid Other | Admitting: Obstetrics and Gynecology

## 2015-12-23 VITALS — BP 134/96 | HR 97 | Temp 97.5°F | Ht 63.0 in | Wt 165.6 lb

## 2015-12-23 DIAGNOSIS — E119 Type 2 diabetes mellitus without complications: Secondary | ICD-10-CM

## 2015-12-23 DIAGNOSIS — G40909 Epilepsy, unspecified, not intractable, without status epilepticus: Secondary | ICD-10-CM | POA: Diagnosis not present

## 2015-12-23 DIAGNOSIS — Z309 Encounter for contraceptive management, unspecified: Secondary | ICD-10-CM

## 2015-12-23 DIAGNOSIS — I10 Essential (primary) hypertension: Secondary | ICD-10-CM | POA: Diagnosis not present

## 2015-12-23 DIAGNOSIS — L910 Hypertrophic scar: Secondary | ICD-10-CM | POA: Diagnosis not present

## 2015-12-23 DIAGNOSIS — Z23 Encounter for immunization: Secondary | ICD-10-CM | POA: Diagnosis not present

## 2015-12-23 LAB — BASIC METABOLIC PANEL WITH GFR
BUN: 11 mg/dL (ref 7–25)
CALCIUM: 9.1 mg/dL (ref 8.6–10.2)
CO2: 19 mmol/L — AB (ref 20–31)
CREATININE: 0.58 mg/dL (ref 0.50–1.10)
Chloride: 105 mmol/L (ref 98–110)
GFR, Est African American: >89 mL/min (ref >=60)
GFR, Est Non African American: >89 mL/min (ref >=60)
Glucose, Bld: 159 mg/dL — ABNORMAL HIGH (ref 65–99)
Potassium: 4.1 mmol/L (ref 3.5–5.3)
SODIUM: 138 mmol/L (ref 135–146)

## 2015-12-23 LAB — POCT GLYCOSYLATED HEMOGLOBIN (HGB A1C): Hemoglobin A1C: 6

## 2015-12-23 MED ORDER — METFORMIN HCL 1000 MG PO TABS: 1000.0000 mg | ORAL_TABLET | Freq: Every day | ORAL | 2 refills | Status: DC

## 2015-12-23 MED ORDER — LISINOPRIL 40 MG PO TABS: 40.0000 mg | ORAL_TABLET | Freq: Every day | ORAL | 2 refills | Status: DC

## 2015-12-23 MED ORDER — METOPROLOL SUCCINATE ER 100 MG PO TB24: 100.0000 mg | ORAL_TABLET | Freq: Every day | ORAL | 2 refills | Status: DC

## 2015-12-23 MED ORDER — LEVETIRACETAM 500 MG PO TABS: ORAL_TABLET | ORAL | 0 refills | Status: DC

## 2015-12-23 MED ORDER — CARBATROL 300 MG PO CP12: 600.0000 mg | ORAL_CAPSULE | Freq: Two times a day (BID) | ORAL | 0 refills | Status: DC

## 2015-12-23 NOTE — Assessment & Plan Note (Addendum)
Known epilepsy disorder. Managed by neurology. Last seizure was in April of this year. Continue current medications. Refilled seizure medication to last her the holidays until patient to follow-up with neurology. Will also collect carbamazepine and Keppra levels since they have not been checked in over a year.

## 2015-12-23 NOTE — Assessment & Plan Note (Signed)
A1c today 6.0. Well controlled diabetes. Continue metformin 1000 mg daily. Recheck A1c in 6 months. Encouraged mother to have patient follow-up with eye exam. Foot exam performed today.

## 2015-12-23 NOTE — Assessment & Plan Note (Signed)
Continues to have keloid on right side of face. Silicone gel strips did not help minimize scarring. Offered mother referral to dermatology to which she declines at this time. Continue to monitor.

## 2015-12-23 NOTE — Patient Instructions (Signed)
It was great to see you today!  I am pleased to hear that things are going well for you.  Here are some of the things we discussed today: -Adjusted BP medication. See new medication list -Refills sent to pharmacy on medications -Schedule eye doctor appointment -Schedule appointment with neurologist -blood work collected will contact you about results   Please schedule a follow-up appointment for 6 months or sooner as needed   Thanks for allowing me to be a part of your care! Dr. Doroteo GlassmanPhelps

## 2015-12-23 NOTE — Assessment & Plan Note (Signed)
Chronic long-standing condition. Pressures elevated in clinic today. Increase lisinopril to 40 mg daily. Continue metoprolol. Mother encouraged to continue to monitor blood pressures daily. Will collect blood work.

## 2015-12-23 NOTE — Progress Notes (Signed)
Subjective: Chief Complaint  Patient presents with  . Follow-up    diabetes and hypertension      HPI: Katelyn [name] is a 30 y.o. presenting to clinic today to discuss the following:  #Hypertension Blood pressure at home: Diastolic pressures elevated. Mom says blood pressures range from 130s systolic over 90s diastolic Blood pressure today: Elevated Taking Meds: Lisinopril 20 MG Toprol XL 100 MG , has been on these medications for a long time Side effects: None ROS: Denies headache, dizziness, visual changes, nausea, vomiting, chest pain, abdominal pain or shortness of breath.  #Diabetes:  Last A1c 5.6 in 09/2014; metformin reduced to daily at that time Sugars - CBG this morning 131 Taking medications: Metformin 1000 mg daily Side effects: No episodes of hypoglycemia Well-balanced diet Last eye exam: Due Last foot exam: Due ROS: denies fever, chills, dizziness, LOC, polyuria, polydipsia, numbness or tingling in extremities or chest pain, visual disturbances.  #Seizure disorder -follows with neurology -Has not been seen in a while -needs medication refills -Has not had an episode of seizure since April of this year  #Birth Control No longer taking anything Was on birth control for heavy menstraul cycles Patient's last menstrual period was 12/20/2015 (exact date).   Health Maintenance: Health Maintenance Due  Topic Date Due  . FOOT EXAM  05/16/2013  . OPHTHALMOLOGY EXAM  01/21/2015  . HEMOGLOBIN A1C  04/09/2015  . PAP SMEAR  04/20/2015  . INFLUENZA VACCINE  08/18/2015     ROS noted in HPI.  Past Medical, Surgical, Social, and Family History Reviewed & Updated per EMR. Smoking status - Non-smoker   Objective: BP (!) 134/96 (BP Location: Right Arm, Patient Position: Sitting, Cuff Size: Normal)   Pulse 97   Temp 97.5 F (36.4 C) (Oral)   Ht 5\' 3"  (1.6 m)   Wt 165 lb 9.6 oz (75.1 kg)   LMP 12/20/2015 (Exact Date)   SpO2 99%   BMI 29.33 kg/m  Vitals  and nursing notes reviewed  Physical Exam Constitutional: She is oriented to person, place, and time and well-developed, well-nourished, and in no distress.  HENT:  Oxycephaly and plagiocephaly. Right side of face with 1cm keloid scar on jaw.  Eyes:  Esotropia   Cardiovascular: Normal rate and regular rhythm.   Pulmonary/Chest: Effort normal and breath sounds normal.  Musculoskeletal: Normal range of motion. She exhibits no edema.  Neurological: She is alert and oriented to person, place, and time.  Psychiatric: Mood and affect normal.    Results for orders placed or performed in visit on 12/23/15 (from the past 72 hour(s))  HgB A1c     Status: None   Collection Time: 12/23/15  9:20 AM  Result Value Ref Range   Hemoglobin A1C 6.0    Diabetic Foot Exam - Simple   Simple Foot Form Diabetic Foot exam was performed with the following findings:  Yes 12/23/2015 10:02 AM  Visual Inspection No deformities, no ulcerations, no other skin breakdown bilaterally:  Yes Sensation Testing Intact to touch and monofilament testing bilaterally:  Yes Pulse Check Posterior Tibialis and Dorsalis pulse intact bilaterally:  Yes Comments     Assessment/Plan: Please see problem based Assessment and Plan  Health Maintainance: Flu vaccine given   Orders Placed This Encounter  Procedures  . Flu Vaccine QUAD 36+ mos IM  . [name] level  . BASIC METABOLIC PANEL WITH GFR  . Carbamazepine level, total  . HgB A1c    Meds ordered this encounter  Medications  . [name] 300 MG 12 hr capsule    Sig: Take 2 capsules (600 mg total) by mouth 2 (two) times daily.    Dispense:  240 capsule    Refill:  0    Brand Name Medically Necessary  . [name] (KEPPRA) 500 MG tablet    Sig: TAKE 1 TABLET(500 MG) BY MOUTH TWICE DAILY    Dispense:  120 tablet    Refill:  0  . lisinopril (PRINIVIL,ZESTRIL) 40 MG tablet    Sig: Take 1 tablet (40 mg total) by mouth daily.    Dispense:  90 tablet     Refill:  2  . metFORMIN (GLUCOPHAGE) 1000 MG tablet    Sig: Take 1 tablet (1,000 mg total) by mouth daily with breakfast.    Dispense:  90 tablet    Refill:  2  . metoprolol succinate (TOPROL-XL) 100 MG 24 hr tablet    Sig: Take 1 tablet (100 mg total) by mouth daily. with food    Dispense:  90 tablet    Refill:  2     Caryl AdaJazma Freddy Spadafora, DO 12/23/2015, 6:54 AM PGY-3, [facility]

## 2015-12-24 LAB — CARBAMAZEPINE LEVEL, TOTAL: Carbamazepine Lvl: 13.1 mg/L — ABNORMAL HIGH (ref 4.0–12.0)

## 2015-12-24 NOTE — Assessment & Plan Note (Signed)
No longer on birth control. Menstraul cycles are better controlled. Mother declines pap smear at this time. Patient not sexually active.

## 2015-12-26 LAB — LEVETIRACETAM LEVEL: Keppra (Levetiracetam): 15.1 ug/mL

## 2015-12-28 ENCOUNTER — Ambulatory Visit (INDEPENDENT_AMBULATORY_CARE_PROVIDER_SITE_OTHER): Payer: Medicaid Other | Admitting: Family

## 2015-12-31 ENCOUNTER — Ambulatory Visit (INDEPENDENT_AMBULATORY_CARE_PROVIDER_SITE_OTHER): Payer: Medicaid Other | Admitting: Family

## 2016-01-04 ENCOUNTER — Encounter (INDEPENDENT_AMBULATORY_CARE_PROVIDER_SITE_OTHER): Payer: Self-pay | Admitting: Family

## 2016-01-04 ENCOUNTER — Ambulatory Visit (INDEPENDENT_AMBULATORY_CARE_PROVIDER_SITE_OTHER): Payer: Medicaid Other | Admitting: Family

## 2016-01-04 VITALS — BP 126/76 | HR 80 | Ht 62.75 in | Wt 165.0 lb

## 2016-01-04 DIAGNOSIS — G40909 Epilepsy, unspecified, not intractable, without status epilepticus: Secondary | ICD-10-CM

## 2016-01-04 DIAGNOSIS — Q039 Congenital hydrocephalus, unspecified: Secondary | ICD-10-CM | POA: Diagnosis not present

## 2016-01-04 DIAGNOSIS — Q043 Other reduction deformities of brain: Secondary | ICD-10-CM | POA: Diagnosis not present

## 2016-01-04 DIAGNOSIS — G479 Sleep disorder, unspecified: Secondary | ICD-10-CM | POA: Diagnosis not present

## 2016-01-04 MED ORDER — LEVETIRACETAM 500 MG PO TABS: ORAL_TABLET | ORAL | 5 refills | Status: DC

## 2016-01-04 MED ORDER — CARBATROL 300 MG PO CP12: 600.0000 mg | ORAL_CAPSULE | Freq: Two times a day (BID) | ORAL | 5 refills | Status: DC

## 2016-01-04 NOTE — Progress Notes (Signed)
Patient: Katelyn [name] MRN: [id number] Sex: female DOB: 1985-07-04  Provider: Elveria Risingina Hau Sanor, NP Location of Care: [facility]  Note type: Routine return visit  History of Present Illness: Referral Source: Pincus LargeJazma Y Phelps, DO History from: patient, [facility] chart and mother Chief Complaint: Epilepsy  Katelyn [name] is a 30 y.o. 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 She has focal motor seizures, intellectual delay and insomnia. She is taking and tolerating [name] and [name] for her seizure disorder. Katelyn [name] 's last seizure occurred in March 2016. She was last seen Jun 01, 2015  Katelyn [name] has had ongoing problems with sleep. She has been evaluated by Dr Golden Hurterohemier and had a sleep study that was normal. Mom said that she Dimples's sleep varies but that she has been sleeping better lately.   Katelyn [name] recently spent some time with her biologic father in [place] following the sudden death of her stepfather. Mom said that she has handled her stepfather's passing fairly well.   Katelyn [name] is at home with her mother each day. Mom would like to find a day program for her to attend a few days per week but has been unable to locate an affordable one. There are no problems with behavior.  Mother has no health concerns today for Katelyn [name] other than previously mentioned  Review of Systems: Please see the HPI for neurologic and other pertinent review of systems. Otherwise, the following systems are noncontributory including constitutional, eyes, ears, nose and throat, cardiovascular,  respiratory, gastrointestinal, genitourinary, musculoskeletal, skin, endocrine, hematologic/lymph, allergic/immunologic and psychiatric.   Past Medical History:  Diagnosis Date  . Agenesis of corpus callosum (HCC)   . Congenital hydrocephalus (HCC)    has VP shunt  . Diabetes mellitus without complication (HCC)   . Hypertension   . Insomnia   . Intellectual delay   . Seizures (HCC)    Hospitalizations: No., Head Injury: No., Nervous System Infections: No., Immunizations up to date: Yes.   Past Medical History Comments: See history  Surgical History Past Surgical History:  Procedure Laterality Date  . SHUNT REPLACEMENT  2011   Was readmitted 1 week later with peritoneal infection from shunt  . WISDOM TOOTH EXTRACTION      Family History family history includes Cancer in her maternal grandmother and paternal grandfather; Diabetes in her father, maternal grandmother, and maternal uncle; Heart disease in her maternal grandfather; Hypertension in her father and mother; Thyroid disease in her maternal grandmother. Family History is otherwise negative for migraines, seizures, cognitive impairment, blindness, deafness, birth defects, chromosomal disorder, autism.  Social History Social History   Social History  . Marital status: Single    Spouse name: N/A  . Number of children: N/A  . Years of education: N/A   Social History Main Topics  . Smoking status: Never Smoker  . Smokeless tobacco: Never Used  . Alcohol use No  . Drug use: No  . Sexual activity: No   Other Topics Concern  . Not on file   Social History Narrative   Katelyn [name]:   Lives with mother.   Denies pets.   Denies smoke exposure   Denies tobacco, drugs, or alcohol use   Hobbies: Mostly listens to music, does lots  of writing, activity books, goes shopping   Went to [employer] until 30 years old.   Brushes own teeth; mom helps her bathe; fixes own drinks; mom prepares meals - takes care of self and mom  helps.   Denies sexual relationship.   Denies concern for STDs.   Caffeine none          Allergies No Known Allergies  Physical Exam BP 126/76   Pulse 80   Ht 5' 2.75" (1.594 m) Comment: With boots on  Wt 165 lb (74.8 kg) Comment: With boots on  LMP 12/20/2015 (Exact Date)   BMI 29.46 kg/m  General: well developed, well nourished female, seated in exam room, in no evident distress. She is quiet but attentive.  Head: She has a misshapen head. She has oxycephaly, plagiocephaly, and megencephaly. She has a VP shunt and esotropia.  Neck: supple with no carotid or supraclavicular bruits.  Respiratory: lungs clear to auscultation  Cardiovascular: regular rate and rhythm, no murmurs   Neurologic Exam  Mental Status: Awake and fully alert. She has mental retardation. She is able to answer some questions but is unable to volunteer information.  Cranial Nerves: Fundoscopic exam reveals sharp disc margins. Pupils equal, briskly reactive to light. She has esotropia. Visual fields appear full to confrontation. Hearing intact and symmetric to whisper. Facial sensation intact. Face, tongue, palate move normally and symmetrically. Neck flexion and extension normal.  Motor: Normal bulk and tone. Normal strength in all tested extremity muscles.  Sensory: Intact to touch and temperature in all extremities.  Coordination: Rapid alternating movements slowed in all extremities. Finger-to-nose and heel-to-shin performed accurately bilaterally.Romberg negative.  Gait and Station: Arises from chair without difficulty. Stance is wide based. Gait demonstrates normal stride length and balance. Able to heel and toe walk fairly well. She could not perform a tandem walk without ataxia.  Reflexes: Diminished and symmetric. Toes downgoing.   Impression 1. Generalized convulsive seizures 2. Focal motor seizures 3. 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 Ventriculoperitoneal shunt 5. Intellectual delay  Recommendations for plan of care The patient's previous [facility] records were reviewed. Katelyn [name] has neither had nor required imaging or lab studies since the last visit. 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 She has generalized convulsive and focal motor seizures and intellectual delay. She has remained seizure free since March 2016 on [name] and [name],and will continue these medications without change.  I will see Katelyn [name] back in follow up in 6 months or sooner if needed. Mom agreed with these plans  The medication list was reviewed and reconciled.  No changes were made in the prescribed medications today.  A complete medication list was provided to the patient/caregiver.  Allergies as of 01/04/2016   No Known Allergies     Medication List       Accurate as of 01/04/16  9:12 AM. Always use your most recent med list.          [name] 300 MG 12 hr  capsule Generic drug:  carbamazepine Take 2 capsules (600 mg total) by mouth 2 (two) times daily.   [name] 500 MG tablet Commonly known as:  KEPPRA TAKE 1 TABLET(500 MG) BY MOUTH TWICE DAILY   lisinopril 40 MG tablet Commonly known as:  PRINIVIL,ZESTRIL Take 1 tablet (40 mg total) by mouth daily.   metFORMIN 1000 MG tablet Commonly known as:  GLUCOPHAGE Take 1 tablet  (1,000 mg total) by mouth daily with breakfast.   metoprolol succinate 100 MG 24 hr tablet Commonly known as:  TOPROL-XL Take 1 tablet (100 mg total) by mouth daily. with food   onetouch ultrasoft lancets Use as instructed       Total time spent with the patient was 20 minutes, of which 50% or more was spent in counseling and coordination of care.   Elveria Rising NP-C

## 2016-01-04 NOTE — Patient Instructions (Signed)
Continue your medication as you have been taking it. Let me know if you have any seizures.   Please plan to return for follow up in 6 months or sooner if needed.

## 2016-01-07 ENCOUNTER — Telehealth (INDEPENDENT_AMBULATORY_CARE_PROVIDER_SITE_OTHER): Payer: Self-pay | Admitting: Family

## 2016-01-07 NOTE — Telephone Encounter (Signed)
Patient seen on 01/04/16 at 9:30am

## 2016-01-07 NOTE — Telephone Encounter (Signed)
-----   Message from Tina Goodpasture, NP sent at 11/16/2015  8:33 AM EDT ----- °Regarding: Needs appointment °Katelyn [name] neeeds an appointment with me.  °Thanks,  °Tina °

## 2016-06-06 ENCOUNTER — Encounter: Payer: Self-pay | Admitting: Obstetrics and Gynecology

## 2016-06-06 ENCOUNTER — Ambulatory Visit (INDEPENDENT_AMBULATORY_CARE_PROVIDER_SITE_OTHER): Payer: Medicaid Other | Admitting: Obstetrics and Gynecology

## 2016-06-06 VITALS — BP 125/95 | HR 103 | Temp 97.7°F | Ht 63.0 in | Wt 173.8 lb

## 2016-06-06 DIAGNOSIS — E785 Hyperlipidemia, unspecified: Secondary | ICD-10-CM

## 2016-06-06 DIAGNOSIS — I10 Essential (primary) hypertension: Secondary | ICD-10-CM

## 2016-06-06 DIAGNOSIS — Z6831 Body mass index (BMI) 31.0-31.9, adult: Secondary | ICD-10-CM

## 2016-06-06 DIAGNOSIS — E66811 Obesity, class 1: Secondary | ICD-10-CM

## 2016-06-06 DIAGNOSIS — E669 Obesity, unspecified: Secondary | ICD-10-CM

## 2016-06-06 DIAGNOSIS — D649 Anemia, unspecified: Secondary | ICD-10-CM | POA: Diagnosis not present

## 2016-06-06 DIAGNOSIS — E119 Type 2 diabetes mellitus without complications: Secondary | ICD-10-CM | POA: Diagnosis not present

## 2016-06-06 LAB — POCT GLYCOSYLATED HEMOGLOBIN (HGB A1C): Hemoglobin A1C: 7.2

## 2016-06-06 MED ORDER — METFORMIN HCL 1000 MG PO TABS: 1000.0000 mg | ORAL_TABLET | Freq: Two times a day (BID) | ORAL | 2 refills | Status: DC

## 2016-06-06 MED ORDER — ASPIRIN EC 81 MG PO TBEC: 81.0000 mg | DELAYED_RELEASE_TABLET | Freq: Every day | ORAL | 11 refills | Status: AC

## 2016-06-06 NOTE — Patient Instructions (Addendum)
Continue to work on healthy lifestyle: - Exercise at least 30-45 minutes a day,  3-4 days a week.  - Eat a low-fat diet with lots of fruits and vegetables, up to 7-9 servings per day.  No change to BP medications  Sent in one medication today - daily aspirin No need for cholesterol medication at this time.  Will contact you about labs

## 2016-06-06 NOTE — Assessment & Plan Note (Signed)
Will recheck cholesterol today. Not on statin . ASCVD risk was 5.1% based of age of 31. Patient low-risk so hold off on statin at this time.

## 2016-06-06 NOTE — Assessment & Plan Note (Signed)
Recheck CBC today. H/o anemia. Denies any symptoms today.

## 2016-06-06 NOTE — Assessment & Plan Note (Signed)
Chronic long-standing. Usually well controlled. Mildly elevated today. Will continue current therapies. Discussed exercise and low salt diet to help with BP. Continue BP monitoring.

## 2016-06-06 NOTE — Assessment & Plan Note (Addendum)
A1c today elevated at 7.2 Will increase metformin back to BID since patient has dietary changes. Continue daily fasting CBG. Patient to follow-up in 6 months for next A1c check. Will send for results from opthalmology exam. Start low-dose ASA.

## 2016-06-06 NOTE — Assessment & Plan Note (Signed)
Increased BMI at 31 today. Patient with sedentary lifestyle and poor eating habits. Encouraged aerobic activity daily and well-balanced diet.

## 2016-06-06 NOTE — Progress Notes (Signed)
     Subjective: Chief Complaint  Patient presents with  . Diabetes  . Hypertension     HPI: Katelyn [name] is a 31 y.o. presenting to clinic today with her mother to discuss the following:  #Weight Gaining weight Have been eating unhealthy  Mom letting her eat more free range - chips, candy In the past mom better watched her food consumption but has been dealing with depression so has not monitored as closely Not exercising  #Hypertension Blood pressure at home: Mom says blood pressures range from 130s systolic over 90s diastolic Taking Meds: Lisinopril 40 MG Toprol XL 100 MG , has been on these medications for a long time Side effects: None ROS: Denies headache, dizziness, visual changes, nausea, vomiting, chest pain, abdominal pain or shortness of breath.  #Diabetes:  Last A1c 6.0 - 6 months ago; new A1c today 7.2 Sugars - CBG this morning 120 Taking medications: Metformin 1000 mg daily Side effects: No episodes of hypoglycemia Last eye exam: last done May 17, 2016 - nearsighted Last foot exam: Up to date ROS: denies fever, chills, dizziness, LOC, polyuria, polydipsia, numbness or tingling in extremities or chest pain, visual disturbances. Not on ASA  Not on statin  ROS noted in HPI.   Past Medical, Surgical, Social, and Family History Reviewed & Updated per EMR.   Pertinent Historical Findings include: Congenital hydrocephalus, seizure disorder,   History  Smoking Status  . Never Smoker  Smokeless Tobacco  . Never Used     Objective: BP (!) 125/95 (BP Location: Right Arm, Patient Position: Sitting, Cuff Size: Normal)   Pulse (!) 103   Temp 97.7 F (36.5 C) (Oral)   Wt 173 lb 12.8 oz (78.8 kg)   SpO2 98%   BMI 31.03 kg/m  Vitals and nursing notes reviewed  Physical Exam Constitutional: She is oriented to person, place, and time and well-developed, well-nourished, and in no distress.  HENT:  Oxycephaly and plagiocephaly. Right side of face with 1cm  keloid scar on jaw. Eyes:  Esotropia  Cardiovascular: Normal rate and regular rhythm.  Pulmonary/Chest: Effort normal and breath sounds normal.  Musculoskeletal: Normal range of motion. She exhibits no edema.  Neurological: She is alert and oriented to person, place, and time.  Psychiatric: Mood and affect normal.   Results for orders placed or performed in visit on 06/06/16 (from the past 72 hour(s))  HgB A1c     Status: None   Collection Time: 06/06/16  9:21 AM  Result Value Ref Range   Hemoglobin A1C 7.2     Assessment/Plan: Please see problem based Assessment and Plan PATIENT EDUCATION PROVIDED: See AVS     Orders Placed This Encounter  Procedures  . HgB A1c    Meds ordered this encounter  Medications  . aspirin EC 81 MG tablet    Sig: Take 1 tablet (81 mg total) by mouth daily.    Dispense:  30 tablet    Refill:  11  . metFORMIN (GLUCOPHAGE) 1000 MG tablet    Sig: Take 1 tablet (1,000 mg total) by mouth 2 (two) times daily with a meal.    Dispense:  90 tablet    Refill:  2     Caryl AdaJazma Corwin Kuiken, DO 06/06/2016, 9:30 AM PGY-3, [facility]

## 2016-06-07 LAB — LIPID PANEL
Chol/HDL Ratio: 3.3 ratio (ref 0.0–4.4)
Cholesterol, Total: 219 mg/dL — ABNORMAL HIGH (ref 100–199)
HDL: 67 mg/dL (ref >39)
LDL CALC: 119 mg/dL — AB (ref 0–99)
Triglycerides: 166 mg/dL — ABNORMAL HIGH (ref 0–149)
VLDL CHOLESTEROL CAL: 33 mg/dL (ref 5–40)

## 2016-06-07 LAB — CBC
Hematocrit: 33.1 % — ABNORMAL LOW (ref 34.0–46.6)
Hemoglobin: 10.7 g/dL — ABNORMAL LOW (ref 11.1–15.9)
MCH: 26.6 pg (ref 26.6–33.0)
MCHC: 32.3 g/dL (ref 31.5–35.7)
MCV: 82 fL (ref 79–97)
PLATELETS: 292 10*3/uL (ref 150–379)
RBC: 4.02 x10E6/uL (ref 3.77–5.28)
RDW: 13.9 % (ref 12.3–15.4)
WBC: 10.4 10*3/uL (ref 3.4–10.8)

## 2016-06-08 ENCOUNTER — Encounter: Payer: Self-pay | Admitting: Obstetrics and Gynecology

## 2016-08-24 ENCOUNTER — Emergency Department (HOSPITAL_COMMUNITY): Payer: Medicare Other

## 2016-08-24 ENCOUNTER — Emergency Department (HOSPITAL_COMMUNITY)
Admission: EM | Admit: 2016-08-24 | Discharge: 2016-08-24 | Disposition: A | Discharge status: Home / Self Care | Payer: Medicare Other | Attending: Emergency Medicine | Admitting: Emergency Medicine

## 2016-08-24 ENCOUNTER — Encounter (HOSPITAL_COMMUNITY): Payer: Self-pay | Admitting: Emergency Medicine

## 2016-08-24 DIAGNOSIS — Z7984 Long term (current) use of oral hypoglycemic drugs: Secondary | ICD-10-CM | POA: Insufficient documentation

## 2016-08-24 DIAGNOSIS — Z79899 Other long term (current) drug therapy: Secondary | ICD-10-CM | POA: Insufficient documentation

## 2016-08-24 DIAGNOSIS — S99912A Unspecified injury of left ankle, initial encounter: Secondary | ICD-10-CM | POA: Diagnosis present

## 2016-08-24 DIAGNOSIS — Y929 Unspecified place or not applicable: Secondary | ICD-10-CM | POA: Diagnosis not present

## 2016-08-24 DIAGNOSIS — S82845A Nondisplaced bimalleolar fracture of left lower leg, initial encounter for closed fracture: Secondary | ICD-10-CM | POA: Diagnosis not present

## 2016-08-24 DIAGNOSIS — S8265XA Nondisplaced fracture of lateral malleolus of left fibula, initial encounter for closed fracture: Secondary | ICD-10-CM | POA: Insufficient documentation

## 2016-08-24 DIAGNOSIS — I10 Essential (primary) hypertension: Secondary | ICD-10-CM | POA: Diagnosis not present

## 2016-08-24 DIAGNOSIS — Y939 Activity, unspecified: Secondary | ICD-10-CM | POA: Diagnosis not present

## 2016-08-24 DIAGNOSIS — Y998 Other external cause status: Secondary | ICD-10-CM | POA: Diagnosis not present

## 2016-08-24 DIAGNOSIS — W108XXA Fall (on) (from) other stairs and steps, initial encounter: Secondary | ICD-10-CM | POA: Insufficient documentation

## 2016-08-24 DIAGNOSIS — S82832A Other fracture of upper and lower end of left fibula, initial encounter for closed fracture: Secondary | ICD-10-CM

## 2016-08-24 DIAGNOSIS — E119 Type 2 diabetes mellitus without complications: Secondary | ICD-10-CM | POA: Diagnosis not present

## 2016-08-24 DIAGNOSIS — Z7982 Long term (current) use of aspirin: Secondary | ICD-10-CM | POA: Diagnosis not present

## 2016-08-24 MED ORDER — HYDROCODONE-ACETAMINOPHEN 5-325 MG PO TABS: 1.0000 | ORAL_TABLET | ORAL | 0 refills | Status: DC | PRN

## 2016-08-24 NOTE — ED Notes (Signed)
Downtime. Pt verbalizes understanding of d/c instructions. Pt given prescription.

## 2016-08-24 NOTE — ED Notes (Signed)
Ortho tech notified.  

## 2016-08-24 NOTE — ED Triage Notes (Addendum)
Pt tripped off steps and fell to knees. Did not hit head. Left ankle swollen and hurting. Cannot bear weight.

## 2016-08-24 NOTE — Progress Notes (Signed)
Orthopedic Tech Progress Note Patient Details:  Paulina FusiJeelisha Bergeson 1985/04/28 [id number]  Ortho Devices Type of Ortho Device: Crutches, CAM walker Ortho Device/Splint Location: applied cam walker to pt left foot/ankle.  pt tolerated application very well. mother is at bedside.  Pt is retarded and mother is at bedside.  Pt was unable to use both crutches but was able to use One crutch to ambulate.  Ortho Device/Splint Interventions: Application, Adjustment   Alvina ChouWilliams, Jerrod Damiano C 08/24/2016, 2:04 PM

## 2016-08-24 NOTE — ED Provider Notes (Signed)
[facility]-EMERGENCY DEPT Provider Note   CSN: [id number] Arrival date & time: 08/24/16  1159  By signing my name below, I, [name], attest that this documentation has been prepared under the direction and in the presence of non-physician practitioner, Garlon Hatchet., PA-C. Electronically Signed: [name], ED Scribe. 08/24/16. 1:47 PM.  History   Chief Complaint Chief Complaint  Patient presents with  . Ankle Pain   The history is provided by the patient. No language interpreter was used.   HPI Comments: Katelyn [name] is a 31 y.o. female who presents to the Emergency Department complaining of sudden onset, moderate left ankle pain that she sustained during a fall down the stairs. Pt states she missed a step while walking down the stairs. No LOC. No hx of ankle injury. Pt has had pain with walking. Pt denies numbness or any other complaints at this time.  Past Medical History:  Diagnosis Date  . Agenesis of corpus callosum (HCC)   . Congenital hydrocephalus (HCC)    has VP shunt  . Diabetes mellitus without complication (HCC)   . Hypertension   . Insomnia   . Intellectual delay   . Seizures [facility])     Patient Active Problem List   Diagnosis Date Noted  . Hyperlipidemia 06/06/2016  . Anemia 06/06/2016  . Keloid 07/24/2015  . Contraception management 02/20/2014  . Insomnia due to medical condition 02/03/2014  . Nocturia more than twice per night 02/03/2014  . Premature birth, 44 to 49 6/7 weeks with 2 or more risk factors 02/03/2014  . Microcephaly (HCC) 02/03/2014  . Disturbance in sleep behavior 10/03/2012  . Congenital reduction deformities of brain (HCC) 10/03/2012  . Abnormality of gait 10/03/2012  . Obesity 06/02/2012  . Routine adult health maintenance 04/21/2012  . HTN (hypertension) 02/13/2012  . Diabetes (HCC) 02/13/2012  . Congenital hydrocephalus (HCC) 02/05/2012  . Seizure disorder (HCC) 02/05/2012    Past Surgical History:  Procedure  Laterality Date  . SHUNT REPLACEMENT  2011   Was readmitted 1 week later with peritoneal infection from shunt  . WISDOM TOOTH EXTRACTION      OB History    Gravida Para Term Preterm AB Living   0 0 0 0 0 0   SAB TAB Ectopic Multiple Live Births   0 0 0 0         Home Medications    Prior to Admission medications   Medication Sig Start Date End Date Taking? Authorizing Provider  aspirin EC 81 MG tablet Take 1 tablet (81 mg total) by mouth daily. 06/06/16   Pincus Large, DO  [name] 300 MG 12 hr capsule Take 2 capsules (600 mg total) by mouth 2 (two) times daily. 01/04/16   Elveria Rising, NP  Lancets [facility] ULTRASOFT) lancets Use as instructed 10/26/12   Leona Singleton, MD  [name] (KEPPRA) 500 MG tablet TAKE 1 TABLET(500 MG) BY MOUTH TWICE DAILY 01/04/16   Elveria Rising, NP  lisinopril (PRINIVIL,ZESTRIL) 40 MG tablet Take 1 tablet (40 mg total) by mouth daily. 12/23/15   Pincus Large, DO  metFORMIN (GLUCOPHAGE) 1000 MG tablet Take 1 tablet (1,000 mg total) by mouth 2 (two) times daily with a meal. 06/06/16   Pincus Large, DO  metoprolol succinate (TOPROL-XL) 100 MG 24 hr tablet Take 1 tablet (100 mg total) by mouth daily. with food 12/23/15   Pincus Large, DO    Family History Family History  Problem Relation Age of Onset  .  Hypertension Mother   . Hypertension Father   . Diabetes Father   . Cancer Maternal Grandmother   . Thyroid disease Maternal Grandmother   . Diabetes Maternal Grandmother   . Heart disease Maternal Grandfather   . Cancer Paternal Grandfather        Died at 6171  . Diabetes Maternal Uncle     Social History Social History  Substance Use Topics  . Smoking status: Never Smoker  . Smokeless tobacco: Never Used  . Alcohol use No     Allergies   Patient has no known allergies.   Review of Systems Review of Systems  Musculoskeletal: Positive for arthralgias and myalgias.  Neurological: Negative for numbness.      Physical Exam Updated Vital Signs BP (!) 149/113 (BP Location: Right Arm)   Pulse (!) 121   Temp 98.5 F (36.9 C) (Oral)   Resp (!) 30   Ht 5\' 4"  (1.626 m)   Wt 162 lb (73.5 kg)   LMP 08/15/2016   SpO2 99%   BMI 27.81 kg/m   Physical Exam  Constitutional: She is oriented to person, place, and time. She appears well-developed and well-nourished.  HENT:  Head: Normocephalic and atraumatic.  Mouth/Throat: Oropharynx is clear and moist.  Eyes: Pupils are equal, round, and reactive to light. Conjunctivae and EOM are normal.  Neck: Normal range of motion.  Cardiovascular: Normal rate, regular rhythm and normal heart sounds.   Pulmonary/Chest: Effort normal and breath sounds normal.  Abdominal: Soft. Bowel sounds are normal.  Musculoskeletal: Normal range of motion. She exhibits edema and tenderness. She exhibits no deformity.  Left ankle with diffuse swelling. Tenderness over the lateral malleolus. No significant bony deformity. DP pulse in tact. Moving all toes normally.  No tenderness of proximal tib/fib area  Neurological: She is alert and oriented to person, place, and time.  Skin: Skin is warm and dry.  Psychiatric: She has a normal mood and affect.  Nursing note and vitals reviewed.    ED Treatments / Results  DIAGNOSTIC STUDIES: Oxygen Saturation is 99% on RA, normal by my interpretation.   COORDINATION OF CARE: 1:38 PM-Discussed next steps with pt including immobilization with a boot and follow up with ortho. Pt verbalized understanding and is agreeable with the plan.   Labs (all labs ordered are listed, but only abnormal results are displayed) Labs Reviewed - No data to display  EKG  EKG Interpretation None       Radiology Dg Ankle Complete Left  Result Date: 08/24/2016 CLINICAL DATA:  Twisting injury after falling down this morning. EXAM: LEFT ANKLE COMPLETE - 3+ VIEW COMPARISON:  None. FINDINGS: There is a nondisplaced fracture of the lateral  malleolus. The fracture likely extends into the syndesmosis. Tiny linear ossific densities at the tip of the medial malleolus may represent a tiny avulsion fracture. Small tibiotalar joint effusion. Diffuse soft tissue swelling about the ankle. Small plantar enthesophyte.  Os peroneum. IMPRESSION: 1. Nondisplaced fracture of the lateral malleolus with adjacent soft tissue swelling. 2. Tiny linear ossific densities at the tip of the medial malleolus may represent a tiny avulsion fracture. Electronically Signed   By: Obie DredgeWilliam T Derry M.D.   On: 08/24/2016 13:11    Procedures Procedures (including critical care time)  Medications Ordered in ED Medications - No data to display   Initial Impression / Assessment and Plan / ED Course  I have reviewed the triage vital signs and the nursing notes.  Pertinent labs & imaging results that  were available during my care of the patient were reviewed by me and considered in my medical decision making (see chart for details).  31 y.o. F here after fall on steps.  No head injury or LOC.  Pain along lateral left ankle on exam.  Swelling noted without significant deformity.  Leg is NVI.  No tenderness of tib/fib area.  X-ray with non-displaced fracture of lateral malleolus.  Question of tiny avulsion fracture of the medial malleolus.  Given patient's intellectual delay, mother does not feel she can walk with crutches.  Will place in cam walker for now.  Will have her follow-up with orthopedics.  Discussed plan with mom, she acknowledged understanding and agreed with plan of care.  Return precautions given for new or worsening symptoms.  Final Clinical Impressions(s) / ED Diagnoses   Final diagnoses:  Other closed fracture of distal end of left fibula, initial encounter    New Prescriptions New Prescriptions   No medications on file   I personally performed the services described in this documentation, which was scribed in my presence. The recorded information  has been reviewed and is accurate.    Garlon Hatchet, PA-C 08/27/16 1711    Rolland Porter, MD 09/02/16 [phone]

## 2016-08-29 ENCOUNTER — Encounter (HOSPITAL_BASED_OUTPATIENT_CLINIC_OR_DEPARTMENT_OTHER): Payer: Self-pay | Admitting: *Deleted

## 2016-08-29 ENCOUNTER — Encounter (HOSPITAL_BASED_OUTPATIENT_CLINIC_OR_DEPARTMENT_OTHER)
Admission: RE | Admit: 2016-08-29 | Discharge: 2016-08-29 | Disposition: A | Discharge status: Home / Self Care | Payer: Medicare Other | Source: Ambulatory Visit | Attending: Orthopedic Surgery | Admitting: Orthopedic Surgery

## 2016-08-29 DIAGNOSIS — E119 Type 2 diabetes mellitus without complications: Secondary | ICD-10-CM | POA: Diagnosis not present

## 2016-08-29 DIAGNOSIS — Z8249 Family history of ischemic heart disease and other diseases of the circulatory system: Secondary | ICD-10-CM | POA: Diagnosis not present

## 2016-08-29 DIAGNOSIS — F79 Unspecified intellectual disabilities: Secondary | ICD-10-CM | POA: Diagnosis not present

## 2016-08-29 DIAGNOSIS — Q040 Congenital malformations of corpus callosum: Secondary | ICD-10-CM | POA: Diagnosis not present

## 2016-08-29 DIAGNOSIS — Z833 Family history of diabetes mellitus: Secondary | ICD-10-CM | POA: Diagnosis not present

## 2016-08-29 DIAGNOSIS — M25571 Pain in right ankle and joints of right foot: Secondary | ICD-10-CM | POA: Diagnosis not present

## 2016-08-29 DIAGNOSIS — Z7984 Long term (current) use of oral hypoglycemic drugs: Secondary | ICD-10-CM | POA: Diagnosis not present

## 2016-08-29 DIAGNOSIS — Z8349 Family history of other endocrine, nutritional and metabolic diseases: Secondary | ICD-10-CM | POA: Diagnosis not present

## 2016-08-29 DIAGNOSIS — Z982 Presence of cerebrospinal fluid drainage device: Secondary | ICD-10-CM | POA: Diagnosis not present

## 2016-08-29 DIAGNOSIS — W109XXA Fall (on) (from) unspecified stairs and steps, initial encounter: Secondary | ICD-10-CM | POA: Diagnosis not present

## 2016-08-29 DIAGNOSIS — I10 Essential (primary) hypertension: Secondary | ICD-10-CM | POA: Diagnosis not present

## 2016-08-29 DIAGNOSIS — Z7982 Long term (current) use of aspirin: Secondary | ICD-10-CM | POA: Diagnosis not present

## 2016-08-29 DIAGNOSIS — S82892A Other fracture of left lower leg, initial encounter for closed fracture: Secondary | ICD-10-CM | POA: Diagnosis not present

## 2016-08-29 DIAGNOSIS — Z79899 Other long term (current) drug therapy: Secondary | ICD-10-CM | POA: Diagnosis not present

## 2016-08-29 DIAGNOSIS — G4700 Insomnia, unspecified: Secondary | ICD-10-CM | POA: Diagnosis not present

## 2016-08-29 LAB — BASIC METABOLIC PANEL
ANION GAP: 11 (ref 5–15)
BUN: 12 mg/dL (ref 6–20)
CO2: 22 mmol/L (ref 22–32)
CREATININE: 0.62 mg/dL (ref 0.44–1.00)
Calcium: 8.7 mg/dL — ABNORMAL LOW (ref 8.9–10.3)
Chloride: 105 mmol/L (ref 101–111)
GFR calc Af Amer: >60 mL/min (ref >60)
Glucose, Bld: 110 mg/dL — ABNORMAL HIGH (ref 65–99)
Potassium: 4.5 mmol/L (ref 3.5–5.1)
Sodium: 138 mmol/L (ref 135–145)

## 2016-08-29 NOTE — Progress Notes (Addendum)
Dr. Nicholaus CorollaHodeirne reviewed Neurology note from 01/04/2016 - ok for surgery if no craniofacial problems. After history from mother on phone. Spoke with Dr. Chaney MallingHodierne will do Anesthesia Consult and he is Ok with aspirin being stopped x 1 day. BMET and EKG today. Bring all medications.

## 2016-08-29 NOTE — Progress Notes (Signed)
Anesthesia consult done by Dr. Nicholaus CorollaHodeirne, EKG reviewed, will proceed with surgery as scheduled.

## 2016-08-30 ENCOUNTER — Encounter (HOSPITAL_BASED_OUTPATIENT_CLINIC_OR_DEPARTMENT_OTHER)
Admission: RE | Disposition: A | Discharge status: Home / Self Care | Payer: Self-pay | Source: Ambulatory Visit | Attending: Orthopedic Surgery

## 2016-08-30 ENCOUNTER — Ambulatory Visit (HOSPITAL_BASED_OUTPATIENT_CLINIC_OR_DEPARTMENT_OTHER)
Admission: RE | Admit: 2016-08-30 | Discharge: 2016-08-30 | Disposition: A | Discharge status: Home / Self Care | Payer: Medicare Other | Source: Ambulatory Visit | Attending: Orthopedic Surgery | Admitting: Orthopedic Surgery

## 2016-08-30 ENCOUNTER — Ambulatory Visit (HOSPITAL_BASED_OUTPATIENT_CLINIC_OR_DEPARTMENT_OTHER): Payer: Medicare Other | Admitting: Certified Registered"

## 2016-08-30 ENCOUNTER — Encounter (HOSPITAL_BASED_OUTPATIENT_CLINIC_OR_DEPARTMENT_OTHER): Payer: Self-pay

## 2016-08-30 DIAGNOSIS — Z79899 Other long term (current) drug therapy: Secondary | ICD-10-CM | POA: Diagnosis not present

## 2016-08-30 DIAGNOSIS — G4700 Insomnia, unspecified: Secondary | ICD-10-CM | POA: Diagnosis not present

## 2016-08-30 DIAGNOSIS — Q040 Congenital malformations of corpus callosum: Secondary | ICD-10-CM | POA: Insufficient documentation

## 2016-08-30 DIAGNOSIS — Z8249 Family history of ischemic heart disease and other diseases of the circulatory system: Secondary | ICD-10-CM | POA: Insufficient documentation

## 2016-08-30 DIAGNOSIS — Z7984 Long term (current) use of oral hypoglycemic drugs: Secondary | ICD-10-CM | POA: Diagnosis not present

## 2016-08-30 DIAGNOSIS — W109XXA Fall (on) (from) unspecified stairs and steps, initial encounter: Secondary | ICD-10-CM | POA: Insufficient documentation

## 2016-08-30 DIAGNOSIS — S82892A Other fracture of left lower leg, initial encounter for closed fracture: Secondary | ICD-10-CM | POA: Diagnosis not present

## 2016-08-30 DIAGNOSIS — Z8349 Family history of other endocrine, nutritional and metabolic diseases: Secondary | ICD-10-CM | POA: Insufficient documentation

## 2016-08-30 DIAGNOSIS — I10 Essential (primary) hypertension: Secondary | ICD-10-CM | POA: Diagnosis not present

## 2016-08-30 DIAGNOSIS — Z7982 Long term (current) use of aspirin: Secondary | ICD-10-CM | POA: Insufficient documentation

## 2016-08-30 DIAGNOSIS — S82842A Displaced bimalleolar fracture of left lower leg, initial encounter for closed fracture: Secondary | ICD-10-CM | POA: Diagnosis not present

## 2016-08-30 DIAGNOSIS — E119 Type 2 diabetes mellitus without complications: Secondary | ICD-10-CM | POA: Diagnosis not present

## 2016-08-30 DIAGNOSIS — Z833 Family history of diabetes mellitus: Secondary | ICD-10-CM | POA: Diagnosis not present

## 2016-08-30 DIAGNOSIS — Z982 Presence of cerebrospinal fluid drainage device: Secondary | ICD-10-CM | POA: Insufficient documentation

## 2016-08-30 DIAGNOSIS — F79 Unspecified intellectual disabilities: Secondary | ICD-10-CM | POA: Insufficient documentation

## 2016-08-30 DIAGNOSIS — G8918 Other acute postprocedural pain: Secondary | ICD-10-CM | POA: Diagnosis not present

## 2016-08-30 DIAGNOSIS — D649 Anemia, unspecified: Secondary | ICD-10-CM | POA: Diagnosis not present

## 2016-08-30 HISTORY — PX: ORIF ANKLE FRACTURE: SHX5408

## 2016-08-30 LAB — GLUCOSE, CAPILLARY
GLUCOSE-CAPILLARY: 124 mg/dL — AB (ref 65–99)
Glucose-Capillary: 144 mg/dL — ABNORMAL HIGH (ref 65–99)

## 2016-08-30 SURGERY — OPEN REDUCTION INTERNAL FIXATION (ORIF) ANKLE FRACTURE
Anesthesia: General | Site: Ankle | Laterality: Left

## 2016-08-30 MED ORDER — OXYCODONE-ACETAMINOPHEN 5-325 MG PO TABS: 1.0000 | ORAL_TABLET | Freq: Four times a day (QID) | ORAL | 0 refills | Status: DC | PRN

## 2016-08-30 MED ORDER — LACTATED RINGERS IV SOLN
INTRAVENOUS | Status: DC
  Administered 2016-08-30 (×2): via INTRAVENOUS

## 2016-08-30 MED ORDER — BUPIVACAINE HCL (PF) 0.5 % IJ SOLN
INTRAMUSCULAR | Status: AC
  Filled 2016-08-30: qty 30

## 2016-08-30 MED ORDER — LIDOCAINE HCL (CARDIAC) 20 MG/ML IV SOLN
INTRAVENOUS | Status: DC | PRN
  Administered 2016-08-30: 30 mg via INTRAVENOUS

## 2016-08-30 MED ORDER — DOCUSATE SODIUM 100 MG PO CAPS: 100.0000 mg | ORAL_CAPSULE | Freq: Two times a day (BID) | ORAL | 0 refills | Status: DC

## 2016-08-30 MED ORDER — FENTANYL CITRATE (PF) 100 MCG/2ML IJ SOLN
INTRAMUSCULAR | Status: AC
  Filled 2016-08-30: qty 2

## 2016-08-30 MED ORDER — LACTATED RINGERS IV SOLN: INTRAVENOUS | Status: DC

## 2016-08-30 MED ORDER — ONDANSETRON HCL 4 MG/2ML IJ SOLN
INTRAMUSCULAR | Status: DC | PRN
  Administered 2016-08-30: 4 mg via INTRAVENOUS

## 2016-08-30 MED ORDER — DEXAMETHASONE SODIUM PHOSPHATE 10 MG/ML IJ SOLN
INTRAMUSCULAR | Status: DC | PRN
  Administered 2016-08-30: 4 mg via INTRAVENOUS

## 2016-08-30 MED ORDER — ACETAMINOPHEN 500 MG PO TABS
1000.0000 mg | ORAL_TABLET | Freq: Once | ORAL | Status: AC
  Administered 2016-08-30: 1000 mg via ORAL

## 2016-08-30 MED ORDER — ONDANSETRON HCL 4 MG PO TABS: 4.0000 mg | ORAL_TABLET | Freq: Three times a day (TID) | ORAL | 0 refills | Status: DC | PRN

## 2016-08-30 MED ORDER — METHOCARBAMOL 500 MG PO TABS: 500.0000 mg | ORAL_TABLET | Freq: Three times a day (TID) | ORAL | 0 refills | Status: DC | PRN

## 2016-08-30 MED ORDER — MIDAZOLAM HCL 2 MG/2ML IJ SOLN
INTRAMUSCULAR | Status: AC
  Filled 2016-08-30: qty 2

## 2016-08-30 MED ORDER — CEFAZOLIN SODIUM-DEXTROSE 2-4 GM/100ML-% IV SOLN
2.0000 g | INTRAVENOUS | Status: AC
  Administered 2016-08-30: 2 g via INTRAVENOUS

## 2016-08-30 MED ORDER — CHLORHEXIDINE GLUCONATE 4 % EX LIQD: 60.0000 mL | Freq: Once | CUTANEOUS | Status: DC

## 2016-08-30 MED ORDER — PROPOFOL 10 MG/ML IV BOLUS
INTRAVENOUS | Status: DC | PRN
  Administered 2016-08-30: 140 mg via INTRAVENOUS

## 2016-08-30 MED ORDER — BUPIVACAINE-EPINEPHRINE (PF) 0.5% -1:200000 IJ SOLN
INTRAMUSCULAR | Status: DC | PRN
  Administered 2016-08-30: 30 mL via PERINEURAL

## 2016-08-30 MED ORDER — SCOPOLAMINE 1 MG/3DAYS TD PT72: 1.0000 | MEDICATED_PATCH | Freq: Once | TRANSDERMAL | Status: DC | PRN

## 2016-08-30 MED ORDER — ACETAMINOPHEN 500 MG PO TABS
ORAL_TABLET | ORAL | Status: AC
  Filled 2016-08-30: qty 2

## 2016-08-30 MED ORDER — PROMETHAZINE HCL 25 MG/ML IJ SOLN: 6.2500 mg | INTRAMUSCULAR | Status: DC | PRN

## 2016-08-30 MED ORDER — MIDAZOLAM HCL 2 MG/2ML IJ SOLN
1.0000 mg | INTRAMUSCULAR | Status: DC | PRN
  Administered 2016-08-30: 2 mg via INTRAVENOUS

## 2016-08-30 MED ORDER — FENTANYL CITRATE (PF) 100 MCG/2ML IJ SOLN
50.0000 ug | INTRAMUSCULAR | Status: DC | PRN
  Administered 2016-08-30: 50 ug via INTRAVENOUS

## 2016-08-30 MED ORDER — FENTANYL CITRATE (PF) 100 MCG/2ML IJ SOLN
25.0000 ug | INTRAMUSCULAR | Status: DC | PRN
  Administered 2016-08-30: 25 ug via INTRAVENOUS

## 2016-08-30 MED ORDER — CEFAZOLIN SODIUM-DEXTROSE 2-4 GM/100ML-% IV SOLN
INTRAVENOUS | Status: AC
  Filled 2016-08-30: qty 100

## 2016-08-30 SURGICAL SUPPLY — 69 items
BANDAGE ACE 4X5 VEL STRL LF (GAUZE/BANDAGES/DRESSINGS) ×3 IMPLANT
BANDAGE ACE 6X5 VEL STRL LF (GAUZE/BANDAGES/DRESSINGS) ×3 IMPLANT
BANDAGE ESMARK 6X9 LF (GAUZE/BANDAGES/DRESSINGS) ×1 IMPLANT
BIT DRILL 2.5X125 (BIT) ×3 IMPLANT
BIT DRILL 3.5X125 (BIT) ×1 IMPLANT
BLADE SURG 15 STRL LF DISP TIS (BLADE) ×2 IMPLANT
BLADE SURG 15 STRL SS (BLADE) ×4
BNDG COHESIVE 4X5 TAN STRL (GAUZE/BANDAGES/DRESSINGS) ×3 IMPLANT
BNDG ESMARK 6X9 LF (GAUZE/BANDAGES/DRESSINGS) ×3
CHLORAPREP W/TINT 26ML (MISCELLANEOUS) ×3 IMPLANT
CLOSURE STERI-STRIP 1/2X4 (GAUZE/BANDAGES/DRESSINGS) ×1
CLSR STERI-STRIP ANTIMIC 1/2X4 (GAUZE/BANDAGES/DRESSINGS) ×2 IMPLANT
COVER BACK TABLE 60X90IN (DRAPES) ×3 IMPLANT
CUFF TOURNIQUET SINGLE 24IN (TOURNIQUET CUFF) IMPLANT
CUFF TOURNIQUET SINGLE 34IN LL (TOURNIQUET CUFF) IMPLANT
DECANTER SPIKE VIAL GLASS SM (MISCELLANEOUS) IMPLANT
DRAPE EXTREMITY T 121X128X90 (DRAPE) ×3 IMPLANT
DRAPE IMP U-DRAPE 54X76 (DRAPES) ×3 IMPLANT
DRAPE OEC MINIVIEW 54X84 (DRAPES) ×3 IMPLANT
DRAPE U-SHAPE 47X51 STRL (DRAPES) ×3 IMPLANT
DRILL BIT 3.5X125 (BIT) ×2
DRSG EMULSION OIL 3X3 NADH (GAUZE/BANDAGES/DRESSINGS) ×3 IMPLANT
DRSG PAD ABDOMINAL 8X10 ST (GAUZE/BANDAGES/DRESSINGS) ×3 IMPLANT
ELECT REM PT RETURN 9FT ADLT (ELECTROSURGICAL) ×3
ELECTRODE REM PT RTRN 9FT ADLT (ELECTROSURGICAL) ×1 IMPLANT
GAUZE SPONGE 4X4 12PLY STRL (GAUZE/BANDAGES/DRESSINGS) ×3 IMPLANT
GLOVE BIO SURGEON STRL SZ7.5 (GLOVE) ×6 IMPLANT
GLOVE BIOGEL PI IND STRL 8 (GLOVE) ×2 IMPLANT
GLOVE BIOGEL PI INDICATOR 8 (GLOVE) ×4
GOWN STRL REUS W/ TWL LRG LVL3 (GOWN DISPOSABLE) ×2 IMPLANT
GOWN STRL REUS W/ TWL XL LVL3 (GOWN DISPOSABLE) ×1 IMPLANT
GOWN STRL REUS W/TWL LRG LVL3 (GOWN DISPOSABLE) ×4
GOWN STRL REUS W/TWL XL LVL3 (GOWN DISPOSABLE) ×2
NEEDLE HYPO 22GX1.5 SAFETY (NEEDLE) IMPLANT
NS IRRIG 1000ML POUR BTL (IV SOLUTION) ×3 IMPLANT
PACK BASIN DAY SURGERY FS (CUSTOM PROCEDURE TRAY) ×3 IMPLANT
PAD CAST 4YDX4 CTTN HI CHSV (CAST SUPPLIES) ×2 IMPLANT
PADDING CAST ABS 4INX4YD NS (CAST SUPPLIES) ×4
PADDING CAST ABS COTTON 4X4 ST (CAST SUPPLIES) ×2 IMPLANT
PADDING CAST COTTON 4X4 STRL (CAST SUPPLIES) ×4
PADDING CAST COTTON 6X4 STRL (CAST SUPPLIES) ×3 IMPLANT
PENCIL BUTTON HOLSTER BLD 10FT (ELECTRODE) ×3 IMPLANT
PLATE TUBULAR 1/3 5H (Plate) ×3 IMPLANT
SCREW CANC 2.5XFT HEX12X4X (Screw) ×1 IMPLANT
SCREW CANCELLOUS 4.0X12MM (Screw) ×2 IMPLANT
SCREW CANCELLOUS 4.0X14 (Screw) ×3 IMPLANT
SCREW CORTEX ST MATTA 3.5X12MM (Screw) ×9 IMPLANT
SCREW CORTEX ST MATTA 3.5X24 (Screw) ×3 IMPLANT
SLEEVE SCD COMPRESS KNEE MED (MISCELLANEOUS) ×3 IMPLANT
SPLINT FAST PLASTER 5X30 (CAST SUPPLIES) ×40
SPLINT PLASTER CAST FAST 5X30 (CAST SUPPLIES) ×20 IMPLANT
SPONGE LAP 4X18 X RAY DECT (DISPOSABLE) ×3 IMPLANT
SUCTION FRAZIER HANDLE 10FR (MISCELLANEOUS) ×2
SUCTION TUBE FRAZIER 10FR DISP (MISCELLANEOUS) ×1 IMPLANT
SUT ETHILON 3 0 PS 1 (SUTURE) IMPLANT
SUT MNCRL AB 4-0 PS2 18 (SUTURE) ×3 IMPLANT
SUT MON AB 2-0 CT1 36 (SUTURE) IMPLANT
SUT MON AB 3-0 SH 27 (SUTURE)
SUT MON AB 3-0 SH27 (SUTURE) IMPLANT
SUT VIC AB 0 SH 27 (SUTURE) ×3 IMPLANT
SUT VIC AB 2-0 SH 27 (SUTURE)
SUT VIC AB 2-0 SH 27XBRD (SUTURE) IMPLANT
SYR BULB 3OZ (MISCELLANEOUS) ×3 IMPLANT
SYR CONTROL 10ML LL (SYRINGE) IMPLANT
TOWEL OR 17X24 6PK STRL BLUE (TOWEL DISPOSABLE) ×6 IMPLANT
TOWEL OR NON WOVEN STRL DISP B (DISPOSABLE) ×3 IMPLANT
TUBE CONNECTING 20'X1/4 (TUBING) ×1
TUBE CONNECTING 20X1/4 (TUBING) ×2 IMPLANT
UNDERPAD 30X30 (UNDERPADS AND DIAPERS) ×3 IMPLANT

## 2016-08-30 NOTE — Transfer of Care (Signed)
Immediate Anesthesia Transfer of Care Note  Patient: Katelyn [name]  Procedure(s) Performed: Procedure(s): OPEN REDUCTION INTERNAL FIXATION (ORIF) ANKLE FRACTURE (Left)  Patient Location: PACU  Anesthesia Type:GA combined with regional for post-op pain  Level of Consciousness: awake and patient cooperative  Airway & Oxygen Therapy: Patient Spontanous Breathing and Patient connected to face mask oxygen  Post-op Assessment: Report given to RN and Post -op Vital signs reviewed and stable  Post vital signs: Reviewed and stable  Last Vitals:  Vitals:   08/30/16 0920 08/30/16 1044  BP:  (!) 142/102  Pulse: (!) 111 (!) 103  Resp: (!) 23   Temp:    SpO2: 100% 100%    Last Pain:  Vitals:   08/30/16 0831  TempSrc: Oral         Complications: No apparent anesthesia complications

## 2016-08-30 NOTE — Anesthesia Procedure Notes (Signed)
Procedure Name: LMA Insertion Date/Time: 08/30/2016 9:40 AM Performed by: Hersel Mcmeen D Pre-anesthesia Checklist: Patient identified, Emergency Drugs available, Suction available and Patient being monitored Patient Re-evaluated:Patient Re-evaluated prior to induction Oxygen Delivery Method: Circle system utilized Preoxygenation: Pre-oxygenation with 100% oxygen Induction Type: IV induction Ventilation: Mask ventilation without difficulty LMA: LMA inserted LMA Size: 3.0 Number of attempts: 1 Airway Equipment and Method: Bite block Placement Confirmation: positive ETCO2 Tube secured with: Tape Dental Injury: Teeth and Oropharynx as per pre-operative assessment

## 2016-08-30 NOTE — Interval H&P Note (Signed)
History and Physical Interval Note:  08/30/2016 9:26 AM  Katelyn [name]  has presented today for surgery, with the diagnosis of left ankle fracture  The various methods of treatment have been discussed with the patient and family. After consideration of risks, benefits and other options for treatment, the patient has consented to  Procedure(s): OPEN REDUCTION INTERNAL FIXATION (ORIF) ANKLE FRACTURE (Left) as a surgical intervention .  The patient's history has been reviewed, patient examined, no change in status, stable for surgery.  I have reviewed the patient's chart and labs.  Questions were answered to the patient's satisfaction.     Nachmen Mansel D

## 2016-08-30 NOTE — Op Note (Signed)
08/30/2016  10:09 AM  PATIENT:  Katelyn [name]    PRE-OPERATIVE DIAGNOSIS:  left ankle fracture  POST-OPERATIVE DIAGNOSIS:  Same  PROCEDURE:  OPEN REDUCTION INTERNAL FIXATION (ORIF) ANKLE FRACTURE  SURGEON:  Javaughn Opdahl, Jewel BaizeIMOTHY D, MD  ASSISTANT: Aquilla HackerHenry Martensen, PA-C, he was present and scrubbed throughout the case, critical for completion in a timely fashion, and for retraction, instrumentation, and closure.   ANESTHESIA:   gen  PREOPERATIVE INDICATIONS:  Katelyn [name] is a  31 y.o. female with a diagnosis of left ankle fracture who failed conservative measures and elected for surgical management.    The risks benefits and alternatives were discussed with the patient preoperatively including but not limited to the risks of infection, bleeding, nerve injury, cardiopulmonary complications, the need for revision surgery, among others, and the patient was willing to proceed.  OPERATIVE IMPLANTS: 1/3 tubular plate  OPERATIVE FINDINGS: Unstable ankle fracture. Stable syndesmosis post op  BLOOD LOSS: min  COMPLICATIONS: none  TOURNIQUET TIME: 30min  OPERATIVE PROCEDURE:  Patient was identified in the preoperative holding area and site was marked by me He was transported to the operating theater and placed on the table in supine position taking care to pad all bony prominences. After a preincinduction time out anesthesia was induced. The left lower extremity was prepped and draped in normal sterile fashion and a pre-incision timeout was performed. Katelyn [name] received ancef for preoperative antibiotics.   I made a lateral incision of roughly 7 cm dissection was carried down sharply to the distal fibula and then spreading dissection was used proximally to protect the superficial peroneal nerve. I sharply incised the periosteum and took care to protect the peroneal tendons. I then debrided the fracture site and performed a reduction maneuver which was held in place with a clamp.    I placed a lag screw across the fracture  I then selected a 6-hole one third tubular plate and placed in a neutralization fashion care was taken distally so as not to penetrate the joint with the cancellus screws.  I then stressed the syndesmosis and it was stable  The wound was then thoroughly irrigated and closed using a 0 Vicryl and absorbable Monocryl sutures. He was placed in a short leg splint.   POST OPERATIVE PLAN: Non-weightbearing. DVT prophylaxis will consist of mobilization and asa

## 2016-08-30 NOTE — Anesthesia Procedure Notes (Signed)
Anesthesia Regional Block: Popliteal block   Pre-Anesthetic Checklist: ,, timeout performed, Correct Patient, Correct Site, Correct Laterality, Correct Procedure, Correct Position, site marked, Risks and benefits discussed,  Surgical consent,  Pre-op evaluation,  At surgeon's request and post-op pain management  Laterality: Left  Prep: chloraprep       Needles:  Injection technique: Single-shot  Needle Type: Echogenic Needle     Needle Length: 9cm  Needle Gauge: 21     Additional Needles:   Procedures: ultrasound guided,,,,,,,,  Narrative:  Start time: 08/30/2016 9:00 AM End time: 08/30/2016 9:08 AM Injection made incrementally with aspirations every 5 mL.  Performed by: Personally  Anesthesiologist: Cecile HearingURK, STEPHEN EDWARD  Additional Notes: No pain on injection. No increased resistance to injection. Injection made in 5cc increments.  Good needle visualization.  Patient tolerated procedure well.  Combined left POPLITEAL/SAPHENOUS nerve block.

## 2016-08-30 NOTE — Interval H&P Note (Signed)
History and Physical Interval Note:  08/30/2016 9:26 AM  Katelyn [name]  has presented today for surgery, with the diagnosis of left ankle fracture  The various methods of treatment have been discussed with the patient and family. After consideration of risks, benefits and other options for treatment, the patient has consented to  Procedure(s): OPEN REDUCTION INTERNAL FIXATION (ORIF) ANKLE FRACTURE (Left) as a surgical intervention .  The patient's history has been reviewed, patient examined, no change in status, stable for surgery.  I have reviewed the patient's chart and labs.  Questions were answered to the patient's satisfaction.     Nikhita Mentzel D   

## 2016-08-30 NOTE — H&P (Signed)
ORTHOPAEDIC CONSULTATION  REQUESTING PHYSICIAN: Renette Butters, MD  Chief Complaint: left ankle fracture  HPI: Katelyn [name] is a 31 y.o. female who complains of a mechanical fall down a few stairs  Past Medical History:  Diagnosis Date  . 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 Congenital hydrocephalus ([place])    has VP shunt  . Diabetes mellitus without complication ([place])   . Hypertension   . Insomnia   . Intellectual delay   . Seizures ([place])    Past Surgical History:  Procedure Laterality Date  . SHUNT REPLACEMENT  2011   Was readmitted 1 week later with peritoneal infection from shunt  . WISDOM TOOTH EXTRACTION     Social History   Social History  . Marital status: Single    Spouse name: N/A  . Number of children: N/A  . Years of education: N/A   Social History Main Topics  . Smoking status: Never Smoker  . Smokeless tobacco: Never Used  . Alcohol use No  . Drug use: No  . Sexual activity: No   Other Topics Concern  . None   Social History Narrative   [name]:   Lives with mother.   Denies pets.   Denies smoke exposure   Denies tobacco, drugs, or alcohol use   Hobbies: Mostly listens to music, does lots of writing, activity books, goes shopping   Went to [place] until 31 years old.   Brushes own teeth; mom helps her bathe; fixes own drinks; mom prepares meals - takes care of self and mom helps.   Denies sexual relationship.   Denies concern for STDs.   Caffeine none         Family History  Problem Relation Age of Onset  . Hypertension Mother   . Hypertension Father   . Diabetes Father   . Cancer Maternal Grandmother   . Thyroid disease Maternal Grandmother   . Diabetes Maternal Grandmother   . Heart disease Maternal Grandfather   . Cancer Paternal Grandfather        Died at 48  . Diabetes Maternal Uncle    No Known Allergies Prior to Admission medications   Medication Sig Start Date End Date Taking? Authorizing Provider    aspirin EC 81 MG tablet Take 1 tablet (81 mg total) by mouth daily. 06/06/16  Yes Phelps, Jazma Y, DO  [name] 300 MG 12 hr capsule Take 2 capsules (600 mg total) by mouth 2 (two) times daily. 01/04/16  Yes Rockwell Germany, NP  HYDROcodone-acetaminophen (NORCO/VICODIN) 5-325 MG tablet Take 1 tablet by mouth every 4 (four) hours as needed. 08/24/16  Yes Larene Pickett, PA-C  [name] (KEPPRA) 500 MG tablet TAKE 1 TABLET(500 MG) BY MOUTH TWICE DAILY 01/04/16  Yes Rockwell Germany, NP  lisinopril (PRINIVIL,ZESTRIL) 40 MG tablet Take 1 tablet (40 mg total) by mouth daily. 12/23/15  Yes Luiz Blare Y, DO  metFORMIN (GLUCOPHAGE) 1000 MG tablet Take 1 tablet (1,000 mg total) by mouth 2 (two) times daily with a meal. 06/06/16  Yes Luiz Blare Y, DO  metoprolol succinate (TOPROL-XL) 100 MG 24 hr tablet Take 1 tablet (100 mg total) by mouth daily. with food 12/23/15  Yes Phelps, Lavonda Jumbo, DO  Lancets [facility] ULTRASOFT) lancets Use as instructed 10/26/12   Hilton Sinclair, MD   No results found.  Positive ROS: All other systems have been reviewed and were otherwise negative with the exception of those mentioned in the HPI and  as above.  Labs cbc No results for input(s): WBC, HGB, HCT, PLT in the last 72 hours.  Labs inflam No results for input(s): CRP in the last 72 hours.  Invalid input(s): ESR  Labs coag No results for input(s): INR, PTT in the last 72 hours.  Invalid input(s): PT   Recent Labs  08/29/16 1455  NA 138  K 4.5  CL 105  CO2 22  GLUCOSE 110*  BUN 12  CREATININE 0.62  CALCIUM 8.7*    Physical Exam: Vitals:   08/30/16 0915 08/30/16 0920  BP: (!) 152/99   Pulse: (!) 122 (!) 111  Resp: (!) 29 (!) 23  Temp:    SpO2: 100% 100%   General: Alert, no acute distress Cardiovascular: No pedal edema Respiratory: No cyanosis, no use of accessory musculature GI: No organomegaly, abdomen is soft and non-tender Skin: No lesions in the area of chief complaint  other than those listed below in MSK exam.  Neurologic: Sensation intact distally save for the below mentioned MSK exam Psychiatric: Patient is competent for consent with normal mood and affect Lymphatic: No axillary or cervical lymphadenopathy  MUSCULOSKELETAL:  LLE: compartments soft, NVI Other extremities are atraumatic with painless ROM and NVI.  Assessment: Left ankle fx  Plan: ORIF today   Renette Butters, MD Cell [phone]   08/30/2016 9:25 AM

## 2016-08-30 NOTE — Anesthesia Postprocedure Evaluation (Signed)
Anesthesia Post Note  Patient: Almira CoasterJeelisha M Morro  Procedure(s) Performed: Procedure(s) (LRB): OPEN REDUCTION INTERNAL FIXATION (ORIF) ANKLE FRACTURE (Left)     Patient location during evaluation: PACU Anesthesia Type: General Level of consciousness: awake and alert Pain management: pain level controlled Vital Signs Assessment: post-procedure vital signs reviewed and stable Respiratory status: spontaneous breathing, nonlabored ventilation and respiratory function stable Cardiovascular status: blood pressure returned to baseline and stable Postop Assessment: no signs of nausea or vomiting Anesthetic complications: no    Last Vitals:  Vitals:   08/30/16 1130 08/30/16 1156  BP:  (!) 132/101  Pulse: 85 95  Resp: 18 18  Temp:  37.1 C  SpO2: 98% 99%    Last Pain:  Vitals:   08/30/16 1156  TempSrc:   PainSc: 0-No pain                 Cecile HearingStephen Edward Nas Wafer

## 2016-08-30 NOTE — Anesthesia Preprocedure Evaluation (Addendum)
Anesthesia Evaluation  Patient identified by MRN, date of birth, ID band Patient awake    Reviewed: Allergy & Precautions, NPO status , Patient's Chart, lab work & pertinent test results, reviewed documented beta blocker date and time   Airway Mallampati: II  TM Distance: >3 FB Neck ROM: Full    Dental  (+) Teeth Intact, Dental Advisory Given   Pulmonary neg pulmonary ROS,    Pulmonary exam normal breath sounds clear to auscultation       Cardiovascular hypertension, Pt. on home beta blockers and Pt. on medications Normal cardiovascular exam Rhythm:Regular Rate:Normal     Neuro/Psych Seizures -, Well Controlled,  Microcephaly; s/p VP shunt for hydrocephalus     GI/Hepatic negative GI ROS, Neg liver ROS,   Endo/Other  diabetes, Type 2, Oral Hypoglycemic Agents  Renal/GU negative Renal ROS     Musculoskeletal left ankle fracture   Abdominal   Peds  (+) mental retardation Hematology negative hematology ROS (+)   Anesthesia Other Findings Day of surgery medications reviewed with the patient.  Reproductive/Obstetrics                             Anesthesia Physical Anesthesia Plan  ASA: III  Anesthesia Plan: General   Post-op Pain Management:  Regional for Post-op pain   Induction: Intravenous  PONV Risk Score and Plan: 3 and Ondansetron, Dexamethasone and Midazolam  Airway Management Planned: LMA  Additional Equipment:   Intra-op Plan:   Post-operative Plan: Extubation in OR  Informed Consent: I have reviewed the patients History and Physical, chart, labs and discussed the procedure including the risks, benefits and alternatives for the proposed anesthesia with the patient or authorized representative who has indicated his/her understanding and acceptance.   Dental advisory given  Plan Discussed with: CRNA  Anesthesia Plan Comments: (Risks/benefits of general anesthesia  discussed with patient including risk of damage to teeth, lips, gum, and tongue, nausea/vomiting, allergic reactions to medications, and the possibility of heart attack, stroke and death.  All patient questions answered.  Patient wishes to proceed.)       Anesthesia Quick Evaluation

## 2016-08-30 NOTE — Discharge Instructions (Signed)
Elevate leg - Toes above nose as much as possible to reduce pain / swelling.  You may loosen and re-apply ace wrap if it feels too tight.  Weight Bearing:  Non weight bearing affected leg.  Diet: As you were doing prior to hospitalization   Shower:  You have a splint on, leave the splint in place and keep the splint dry with a plastic bag.  Dressing:  You have a splint. Leave the splint in place and we will change your bandages during your first follow-up appointment.    Activity:  Increase activity slowly as tolerated, but follow the weight bearing instructions below.  The rules on driving is that you can not be taking narcotics while you drive, and you must feel in control of the vehicle.    To prevent constipation:  Narcotic medicines cause constipation.  Wean these as soon as is appropriate.   You may use a stool softener such as -  Colace (over the counter) 100 mg by mouth twice a day  Drink plenty of fluids (prune juice may be helpful) and high fiber foods Miralax (over the counter) for constipation as needed.    Itching:  If you experience itching with your medications, try taking only a single pain pill, or even half a pain pill at a time.  You may take up to 10 pain pills per day, and you can also use benadryl over the counter for itching or also to help with sleep.   Precautions:  If you experience chest pain or shortness of breath - call 911 immediately for transfer to the hospital emergency department!!  If you develop a fever greater that 101 F, purulent drainage from wound, increased redness or drainage from wound, or calf pain -- Call the office at [phone]                                                 Follow- Up Appointment:  Please call for an appointment to be seen in 2 weeks [place] - [phone]    Post Anesthesia Home Care Instructions  Activity: Get plenty of rest for the remainder of the day. A responsible individual must stay with you for 24  hours following the procedure.  For the next 24 hours, DO NOT: -Drive a car -Advertising copywriterperate machinery -Drink alcoholic beverages -Take any medication unless instructed by your physician -Make any legal decisions or sign important papers.  Meals: Start with liquid foods such as gelatin or soup. Progress to regular foods as tolerated. Avoid greasy, spicy, heavy foods. If nausea and/or vomiting occur, drink only clear liquids until the nausea and/or vomiting subsides. Call your physician if vomiting continues.  Special Instructions/Symptoms: Your throat may feel dry or sore from the anesthesia or the breathing tube placed in your throat during surgery. If this causes discomfort, gargle with warm salt water. The discomfort should disappear within 24 hours.  If you had a scopolamine patch placed behind your ear for the management of post- operative nausea and/or vomiting:  1. The medication in the patch is effective for 72 hours, after which it should be removed.  Wrap patch in a tissue and discard in the trash. Wash hands thoroughly with soap and water. 2. You may remove the patch earlier than 72 hours if you experience unpleasant side effects which may include dry mouth, dizziness  or visual disturbances. 3. Avoid touching the patch. Wash your hands with soap and water after contact with the patch.     Regional Anesthesia Blocks  1. Numbness or the inability to move the "blocked" extremity may last from 3-48 hours after placement. The length of time depends on the medication injected and your individual response to the medication. If the numbness is not going away after 48 hours, call your surgeon.  2. The extremity that is blocked will need to be protected until the numbness is gone and the  Strength has returned. Because you cannot feel it, you will need to take extra care to avoid injury. Because it may be weak, you may have difficulty moving it or using it. You may not know what position it is in  without looking at it while the block is in effect.  3. For blocks in the legs and feet, returning to weight bearing and walking needs to be done carefully. You will need to wait until the numbness is entirely gone and the strength has returned. You should be able to move your leg and foot normally before you try and bear weight or walk. You will need someone to be with you when you first try to ensure you do not fall and possibly risk injury.  4. Bruising and tenderness at the needle site are common side effects and will resolve in a few days.  5. Persistent numbness or new problems with movement should be communicated to the surgeon or the [facility] [phone] [facility] [phone]).

## 2016-08-30 NOTE — Progress Notes (Signed)
Assisted Dr. Turk with left, ultrasound guided, popliteal/saphenous, adductor canal block. Side rails up, monitors on throughout procedure. See vital signs in flow sheet. Tolerated Procedure well. 

## 2016-08-31 ENCOUNTER — Encounter (HOSPITAL_BASED_OUTPATIENT_CLINIC_OR_DEPARTMENT_OTHER): Payer: Self-pay | Admitting: Orthopedic Surgery

## 2016-09-06 ENCOUNTER — Other Ambulatory Visit: Payer: Self-pay | Admitting: Obstetrics and Gynecology

## 2016-09-09 DIAGNOSIS — S82842D Displaced bimalleolar fracture of left lower leg, subsequent encounter for closed fracture with routine healing: Secondary | ICD-10-CM | POA: Diagnosis not present

## 2016-10-07 DIAGNOSIS — S82842D Displaced bimalleolar fracture of left lower leg, subsequent encounter for closed fracture with routine healing: Secondary | ICD-10-CM | POA: Diagnosis not present

## 2016-10-27 IMAGING — CT CT HEAD W/O CM
2 series · 16 of 30 positions shown, 18 images · non-contrast
Comparison: 06/11/2009

CLINICAL DATA: Seizure.

EXAM:
CT HEAD WITHOUT CONTRAST
TECHNIQUE: Contiguous axial images were obtained from the base of the skull
through the vertex without intravenous contrast.

[Series 201: head w/o, idose (1) · axial · non-contrast · 0.49mm/px · z∈[+122,+262]mm · 8 of 37 slices shown, 10 images]
[im 5/37  brain]
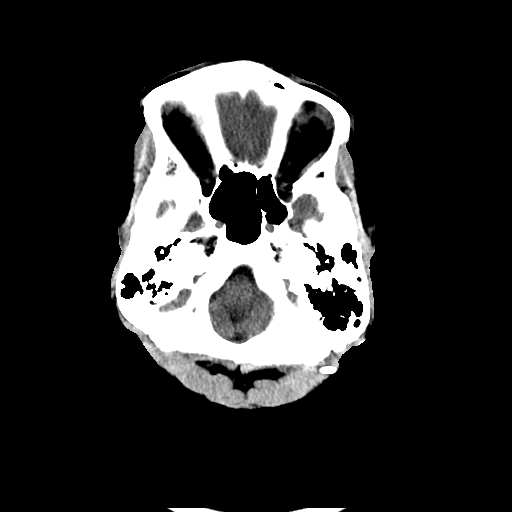
[im 5/37  bone]
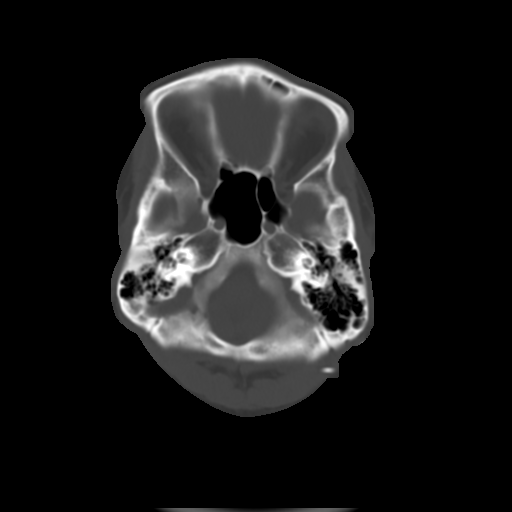
[im 9/37  brain]
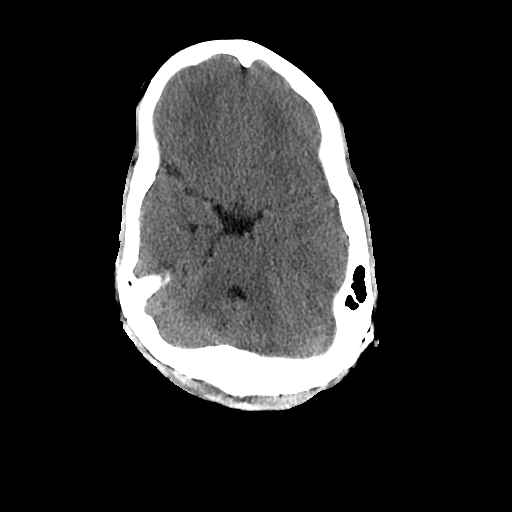
[im 13/37  brain]
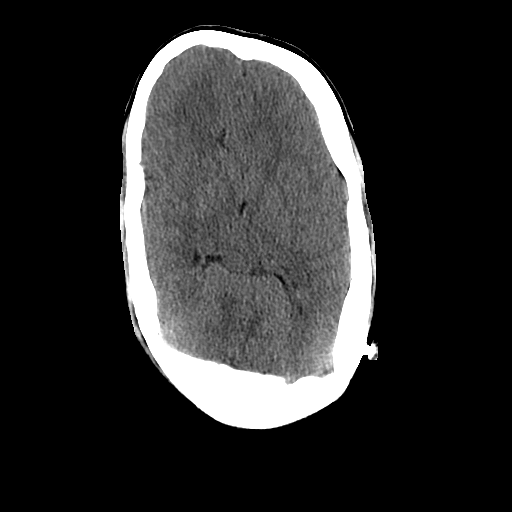
[im 17/37  brain]
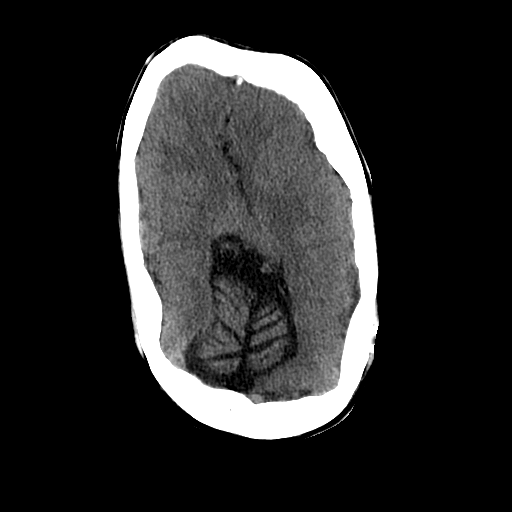
[im 21/37  brain]
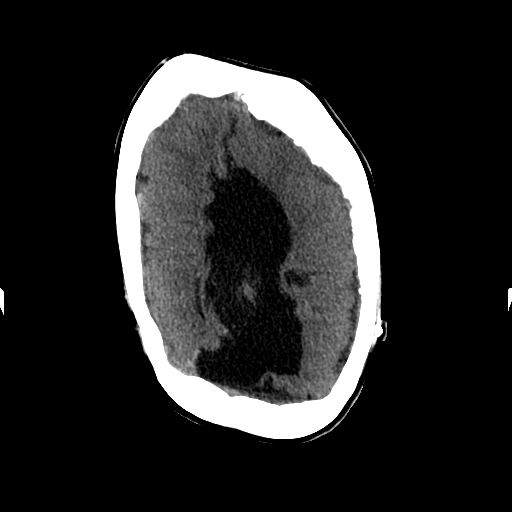
[im 21/37  bone]
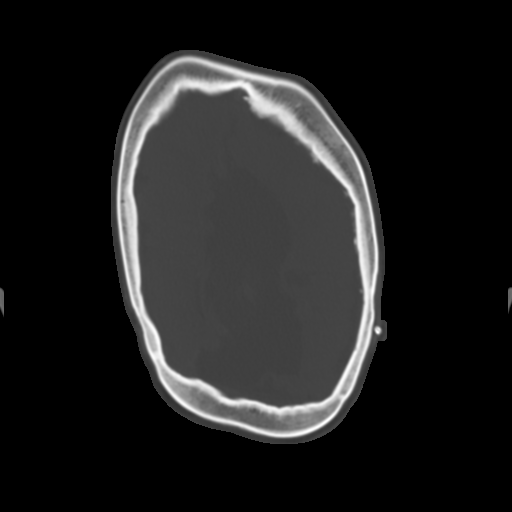
[im 25/37  brain]
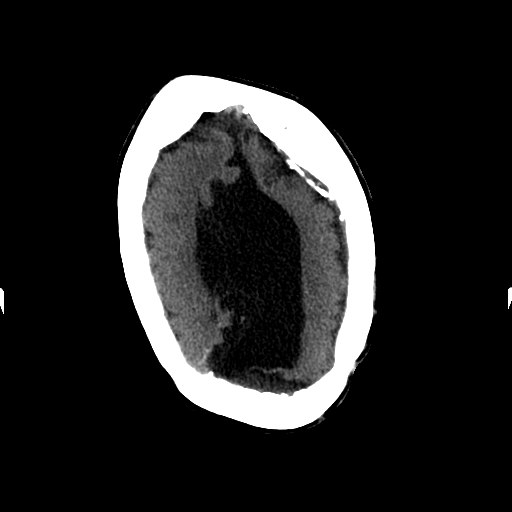
[im 29/37  brain]
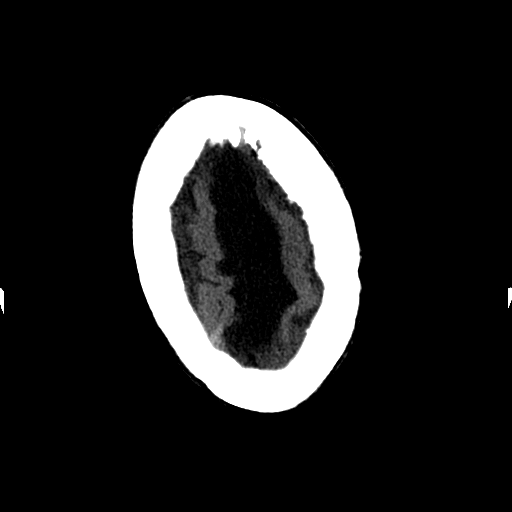
[im 33/37  brain]
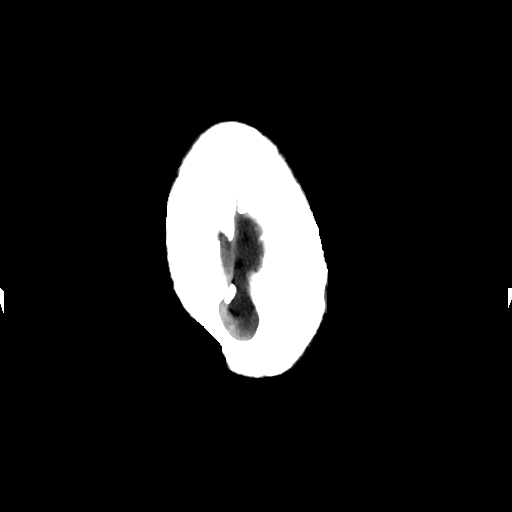

[Series 202: head w/o bone, idose (1) · axial · non-contrast · 0.49mm/px · z∈[+119,+264]mm · 8 of 74 slices shown]
[im 8/74  bone]
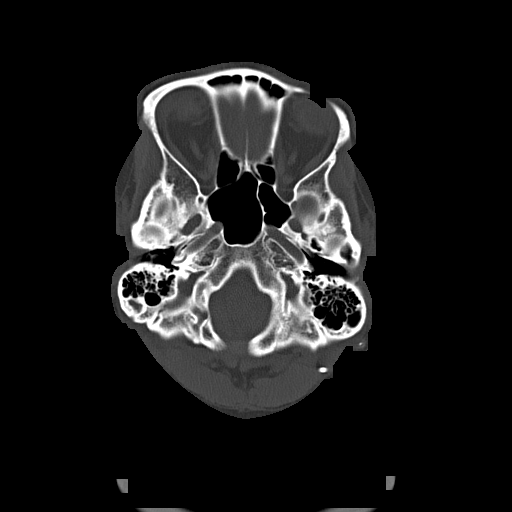
[im 16/74  bone]
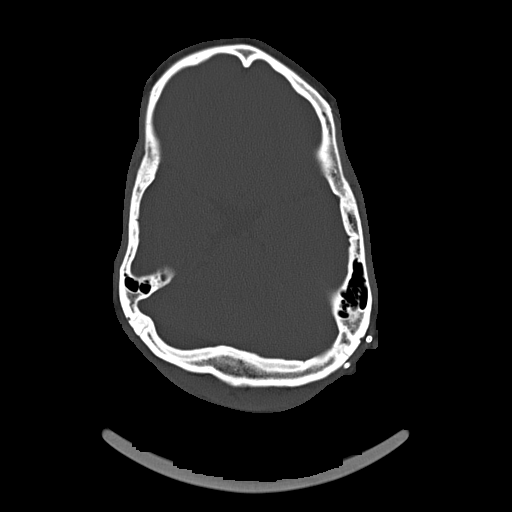
[im 24/74  bone]
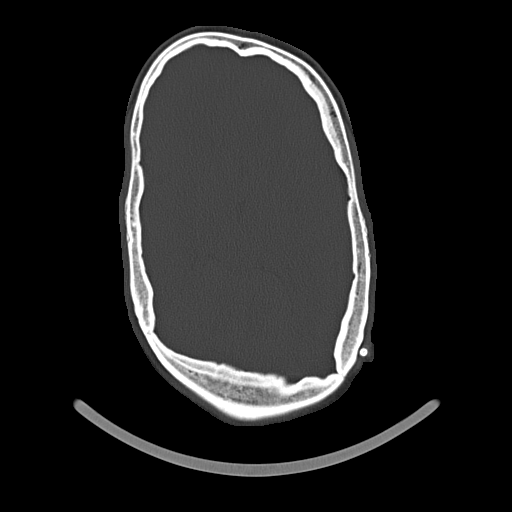
[im 31/74  bone]
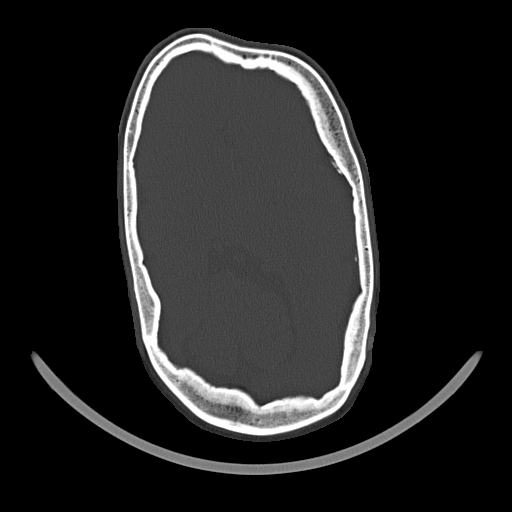
[im 43/74  bone]
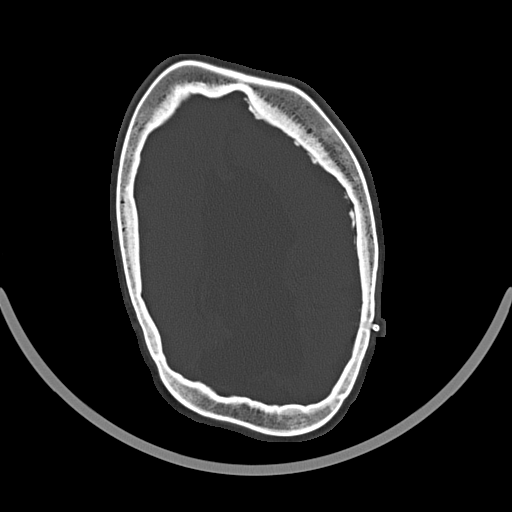
[im 50/74  bone]
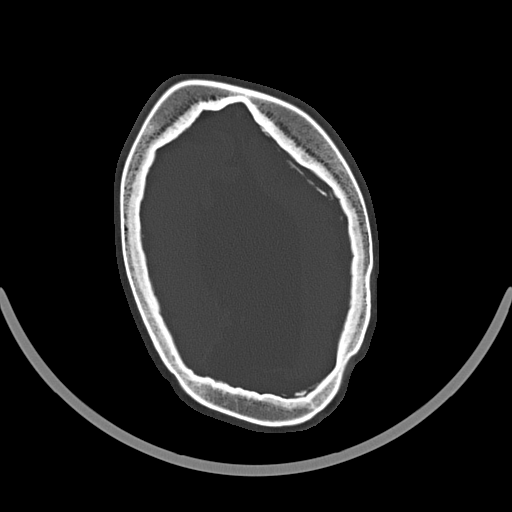
[im 58/74  bone]
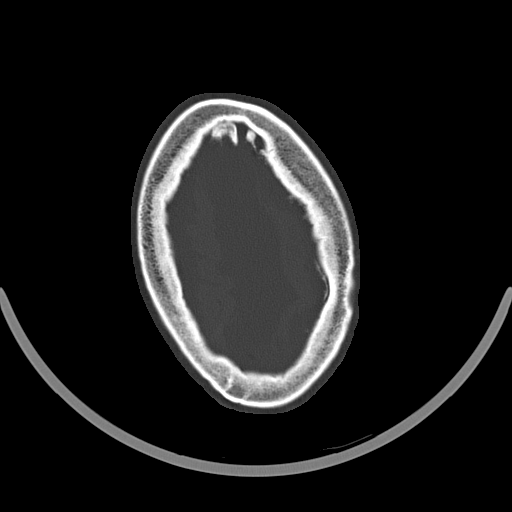
[im 66/74  bone]
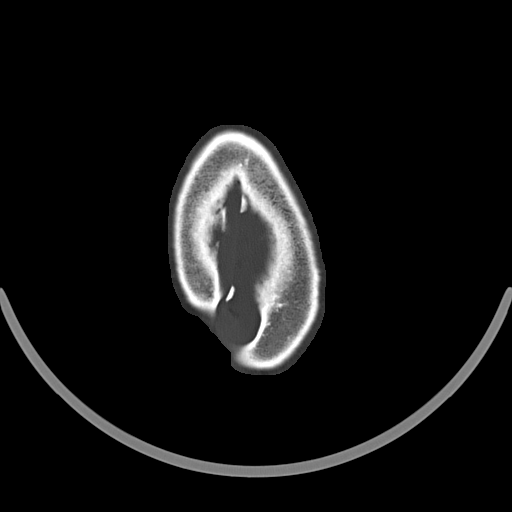

[16 of 30 positions shown; findings below may reference images not displayed]

FINDINGS: Plagiocephaly skull shape unchanged. Defect over the high midline
parotid occipital as well as changes over the left skull unchanged.
Left parietal ventriculostomy catheter unchanged with tip along the
right lateral border of a large midline CSF space superimposed over
the interhemispheric fissure. This prominent CSF space is slightly
smaller overall. There is no focal mass or mass effect. No acute
hemorrhage as remainder of the exam is unchanged.
IMPRESSION: Chronic stable congenital findings as described with a left
posterior parietal ventriculostomy catheter unchanged. The
ventriculostomy catheter position is unchanged along the right
lateral border of a prominent midline CSF space which appears to
communicate with the lateral ventricles as this is overall slightly
smaller.

## 2016-11-03 ENCOUNTER — Encounter: Payer: Medicare Other | Admitting: Family Medicine

## 2016-11-06 ENCOUNTER — Other Ambulatory Visit: Payer: Self-pay | Admitting: Obstetrics and Gynecology

## 2016-11-07 ENCOUNTER — Other Ambulatory Visit: Payer: Self-pay | Admitting: Family Medicine

## 2016-11-07 NOTE — Telephone Encounter (Signed)
Please have Katelyn [name] come in for follow up visit for DM. Metformin refilled. Thanks!

## 2016-11-08 NOTE — Telephone Encounter (Signed)
Caregiver informed.  Scheduled patient on 11/29/16 at 1:30pm. Keirstyn Aydt,CMA

## 2016-11-28 DIAGNOSIS — S82842D Displaced bimalleolar fracture of left lower leg, subsequent encounter for closed fracture with routine healing: Secondary | ICD-10-CM | POA: Diagnosis not present

## 2016-11-29 ENCOUNTER — Encounter: Payer: Self-pay | Admitting: Family Medicine

## 2016-11-29 ENCOUNTER — Ambulatory Visit (INDEPENDENT_AMBULATORY_CARE_PROVIDER_SITE_OTHER): Payer: Medicare Other | Admitting: Family Medicine

## 2016-11-29 ENCOUNTER — Other Ambulatory Visit: Payer: Self-pay

## 2016-11-29 VITALS — BP 110/70 | HR 95 | Temp 98.1°F | Ht 64.0 in | Wt 168.0 lb

## 2016-11-29 DIAGNOSIS — E119 Type 2 diabetes mellitus without complications: Secondary | ICD-10-CM

## 2016-11-29 DIAGNOSIS — I10 Essential (primary) hypertension: Secondary | ICD-10-CM

## 2016-11-29 DIAGNOSIS — Z23 Encounter for immunization: Secondary | ICD-10-CM

## 2016-11-29 LAB — POCT GLYCOSYLATED HEMOGLOBIN (HGB A1C): HEMOGLOBIN A1C: 6.2

## 2016-11-29 MED ORDER — METOPROLOL SUCCINATE ER 100 MG PO TB24: 100.0000 mg | ORAL_TABLET | Freq: Every day | ORAL | 2 refills | Status: DC

## 2016-11-29 MED ORDER — METFORMIN HCL 1000 MG PO TABS: ORAL_TABLET | ORAL | 0 refills | Status: DC

## 2016-11-29 MED ORDER — ONETOUCH ULTRASOFT LANCETS MISC: 12 refills | Status: AC

## 2016-11-29 MED ORDER — LISINOPRIL 20 MG PO TABS: 20.0000 mg | ORAL_TABLET | Freq: Every day | ORAL | 0 refills | Status: DC

## 2016-11-29 MED ORDER — GLUCOSE BLOOD VI STRP: ORAL_STRIP | 12 refills | Status: AC

## 2016-11-29 NOTE — Assessment & Plan Note (Signed)
  Chronic, well controlled on Metformin. A1C today improved to 6.2 from 7.1  -will decrease metformin to 1000 mg in am and 500 mg in pm -follow up in 3months to assess A1C -lancets and test strips refilled -patient to see optho before end of year

## 2016-11-29 NOTE — Assessment & Plan Note (Addendum)
  Blood pressure well controlled, below goal today. 110/70  -will decrease lisinopril from 40 mg daily to 20 mg daily -follow up 3 months -continue metoprolol, refilled this -refilled aspirin

## 2016-11-29 NOTE — Patient Instructions (Signed)
   It was great seeing you today!  Please reduce lisinopril to 20 mg daily (I sent in new prescription) Please reduce metformin to 1 tablet in morning and 1/2 tablet at night.  I'll see you back in 3 months to follow up on Blood Pressure and Diabetes.  If you have questions or concerns please do not hesitate to call at [phone].  Dolores PattyAngela Aneyah Lortz, DO PGY-2, [facility] 11/29/2016 1:45 PM

## 2016-11-29 NOTE — Progress Notes (Signed)
   Subjective:    Patient ID: Katelyn [name], female    DOB: 10/31/1985, 31 y.o.   MRN: [id number]   CC: needs refills, DM  DM- doing well, taking 1000 mg metformin BID. No complaints. Has yearly appointment with eye doctor coming up. No increased thirst, urination, hunger. No numbness or tingling in hands or feet.  Needs refills on all medications. No other changes to healthcare since last visit, she is doing well overall. Had surgery on ankle due to fracture after falling down stairs. Was cleared by ortho yesterday. Mom is with her today.   Review of Systems- no recent illnesses, unintentional weight changes   Objective:  BP 110/70   Pulse 95   Temp 98.1 F (36.7 C) (Oral)   Ht 5\' 4"  (1.626 m)   Wt 168 lb (76.2 kg)   LMP 11/24/2016 (Approximate)   SpO2 97%   BMI 28.84 kg/m  Vitals and nursing note reviewed  General: well nourished, in no acute distress HEENT: plagiocephaly, no scleral icterus or conjunctival pallor, no nasal discharge, moist mucous membranes, posterior oropharynx without erythema or discharge Neck: supple, non-tender, without lymphadenopathy Cardiac: RRR, clear S1 and S2, no murmurs, rubs, or gallops Respiratory: clear to auscultation bilaterally, no increased work of breathing Abdomen: soft, nontender, nondistended, no masses or organomegaly. Bowel sounds present Extremities: no edema or cyanosis. Skin: warm and dry, no rashes noted Neuro: alert and oriented, no focal deficits  Assessment & Plan:    Diabetes (HCC)  Chronic, well controlled on Metformin. A1C today improved to 6.2 from 7.1  -will decrease metformin to 1000 mg in am and 500 mg in pm -follow up in 3months to assess A1C -lancets and test strips refilled -patient to see optho before end of year  HTN (hypertension)  Blood pressure well controlled, below goal today. 110/70  -will decrease lisinopril from 40 mg daily to 20 mg daily -follow up 3 months -continue metoprolol, refilled  this -refilled aspirin   Patient received flu shot today.  Return in about 3 months (around 03/01/2017).   Dolores PattyAngela Kam Rahimi, DO Family Medicine Resident PGY-2

## 2017-01-09 ENCOUNTER — Other Ambulatory Visit (INDEPENDENT_AMBULATORY_CARE_PROVIDER_SITE_OTHER): Payer: Self-pay | Admitting: Family

## 2017-01-09 DIAGNOSIS — G40909 Epilepsy, unspecified, not intractable, without status epilepticus: Secondary | ICD-10-CM

## 2017-01-13 ENCOUNTER — Other Ambulatory Visit (INDEPENDENT_AMBULATORY_CARE_PROVIDER_SITE_OTHER): Payer: Self-pay | Admitting: Family

## 2017-01-13 DIAGNOSIS — G40909 Epilepsy, unspecified, not intractable, without status epilepticus: Secondary | ICD-10-CM

## 2017-01-17 ENCOUNTER — Other Ambulatory Visit: Payer: Self-pay | Admitting: Obstetrics and Gynecology

## 2017-01-25 ENCOUNTER — Ambulatory Visit (INDEPENDENT_AMBULATORY_CARE_PROVIDER_SITE_OTHER): Payer: Self-pay | Admitting: Family

## 2017-01-31 ENCOUNTER — Ambulatory Visit (INDEPENDENT_AMBULATORY_CARE_PROVIDER_SITE_OTHER): Payer: Medicare Other | Admitting: Family

## 2017-01-31 ENCOUNTER — Encounter (INDEPENDENT_AMBULATORY_CARE_PROVIDER_SITE_OTHER): Payer: Self-pay | Admitting: Family

## 2017-01-31 VITALS — BP 120/70 | HR 68 | Ht 62.75 in | Wt 164.8 lb

## 2017-01-31 DIAGNOSIS — Q039 Congenital hydrocephalus, unspecified: Secondary | ICD-10-CM

## 2017-01-31 DIAGNOSIS — Z982 Presence of cerebrospinal fluid drainage device: Secondary | ICD-10-CM

## 2017-01-31 DIAGNOSIS — F819 Developmental disorder of scholastic skills, unspecified: Secondary | ICD-10-CM

## 2017-01-31 DIAGNOSIS — Q043 Other reduction deformities of brain: Secondary | ICD-10-CM

## 2017-01-31 DIAGNOSIS — G40909 Epilepsy, unspecified, not intractable, without status epilepticus: Secondary | ICD-10-CM

## 2017-01-31 MED ORDER — CARBATROL 300 MG PO CP12: 600.0000 mg | ORAL_CAPSULE | Freq: Two times a day (BID) | ORAL | 5 refills | Status: DC

## 2017-01-31 MED ORDER — LEVETIRACETAM 500 MG PO TABS: ORAL_TABLET | ORAL | 5 refills | Status: DC

## 2017-01-31 NOTE — Progress Notes (Signed)
Patient: Katelyn [name] MRN: [id number] Sex: female DOB: 01-Aug-1985  Provider: Elveria Rising, NP Location of Care: [facility]  Note type: Routine return visit  History of Present Illness: Referral Source: Pincus Large, DO History from: mother, patient and [facility] chart Chief Complaint: Epilepsy  Katelyn [name] is a 32 y.o. 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 She has focal motor seizures, intellectual delay and insomnia. Crystle was last seen January 04, 2016. She is taking and tolerating [name] and [name] for her seizure disorder, and has remained seizure free since March 2016.   Trachelle has had longstanding problems with insomnia but Mom says that she has been sleeping better recently. She has a good appetite and Mom has to help her with portion sizes because of concerns about prediabetes. Mom says that Alyne has been generally healthy other than a fractured ankle last summer. She fortunately recovered well from that. Mom has no other health concerns for Olayinka today other than previously mentioned.   Review of Systems: Please see the HPI for neurologic and other pertinent review of systems. Otherwise, all other systems were reviewed and were negative.    Past Medical History:  Diagnosis Date  . Agenesis of corpus callosum (HCC)   . Congenital hydrocephalus (HCC)    has VP shunt  . Diabetes mellitus without complication (HCC)   . Hypertension   . Insomnia   . Intellectual delay   . Seizures (HCC)    Hospitalizations: No., Head Injury: No., Nervous System Infections: No., Immunizations up to date: Yes.   Past  Medical History Comments: See history   Surgical History Past Surgical History:  Procedure Laterality Date  . ORIF ANKLE FRACTURE Left 08/30/2016   Procedure: OPEN REDUCTION INTERNAL FIXATION (ORIF) ANKLE FRACTURE;  Surgeon: Sheral Apley, MD;  Location:  SUR[facility]ERY CENTER;  Service: Orthopedics;  Laterality: Left;  . SHUNT REPLACEMENT  2011   Was readmitted 1 week later with peritoneal infection from shunt  . WISDOM TOOTH EXTRACTION      Family History family history includes Cancer in her maternal grandmother and paternal grandfather; Diabetes in her father, maternal grandmother, and maternal uncle; Heart disease in her maternal grandfather; Hypertension in her father and mother; Thyroid disease in her maternal grandmother. Family History is otherwise negative for migraines, seizures, cognitive impairment, blindness, deafness, birth defects, chromosomal disorder, autism.  Social History Social History   Socioeconomic History  . Marital status: Single    Spouse name: None  . Number of children: None  . Years of education: None  . Highest education level: None  Social Needs  . Financial resource strain: None  . Food insecurity - worry: None  . Food insecurity - inability: None  . Transportation needs - medical: None  . Transportation needs - non-medical: None  Occupational History  . None  Tobacco Use  . Smoking status: Never Smoker  . Smokeless tobacco: Never Used  Substance and Sexual Activity  . Alcohol use: No  . Drug use: No  . Sexual activity: No    Birth control/protection: Abstinence  Other Topics Concern  . None  Social History Narrative   Katelyn [name]:   Lives with mother.   Denies pets.   Denies smoke exposure  Denies tobacco, drugs, or alcohol use   Hobbies: Mostly listens to music, does lots of writing, activity books, goes shopping   Went to [employer] until 32 years old.   Brushes own teeth; mom helps her bathe; fixes own drinks; mom  prepares meals - takes care of self and mom helps.   Denies sexual relationship.   Denies concern for STDs.   Caffeine none       Allergies No Known Allergies  Physical Exam BP 120/70   Pulse 68   Ht 5' 2.75" (1.594 m)   Wt 164 lb 12.8 oz (74.8 kg)   BMI 29.43 kg/m  General: well developed, well nourished woman, seated on exam table, in no evident distress; black hair, brown eyes, right handed Head: She has misshapen head with oxycephaly, plagiocephaly and megencephaly. She has VP shunt and esotropia. Oropharynx benign. Neck: supple with no carotid bruits.  Cardiovascular: regular rate and rhythm, no murmurs. Respiratory: Clear to auscultation bilaterally Abdomen: Bowel sounds present all four quadrants, abdomen soft, non-tender, non-distended. No hepatosplenomegaly or masses palpated. Musculoskeletal: No skeletal deformities or obvious scoliosis Skin: no rashes or neurocutaneous lesions  Neurologic Exam Mental Status: Awake and fully alert.  Fund of knowledge very subnormal for age.  Speech with some dysarthria.  She is able to answer a few questions but unable to volunteer information. Able to follow commands and participate in examination fairly well. Cranial Nerves: Fundoscopic exam - red reflex present.  Unable to fully visualize fundus.  Pupils equal briskly reactive to light.  Extraocular movements full without nystagmus.  Visual fields full to confrontation.  She has esotropia. Hearing intact and symmetric to whisper.  Symmetrical facial strength with midline tongue and uvula. Neck flexion normal Motor: Normal bulk and tone.  Normal strength in all tested extremity muscles. Sensory: Intact to touch and temperature in all extremities. Coordination: Rapid movements: finger and toe tapping slowed and symmetric bilaterally.  Finger-to-nose and heel-to-shin intact bilaterally.  Had difficulty understanding instructions for Romberg testing. Gait and Station: Arises from chair,  without difficulty. Stance is wide based.  Gait demonstrates normal stride length and balance. Able to heel and toe walk fairly well but could not perform tandem walk without ataxia. Reflexes: Diminished and symmetric. Toes downgoing. No clonus.  Impression 1. Generalized convulsive seizures 2. Focal motor seizures 3.  Complex cranial malformation with dorsal third ventrical cyst, agenesis of corpus callosum, oxycephaly, plagiocephaly, partial fusion of the thalamus, enlarged central monoventricle with normal third and fourth ventricles suggesting semilobar holoprosencephaly 4.  Ventriculoperitoneal shunt 5.  Intellectual delay  Recommendations for plan of care The patient's previous [facility] records were reviewed. [name] has neither had nor required imaging or lab studies since the last visit. She is a 32 year old woman with seizure disorder, complex cranial malformations and intellectual delay. She is taking and tolerating [name] and [name] for her seizure disorder and has been seizure free since March 2016. She will continue on her antiepileptic medications without change and will return in 6 months or sooner if needed for follow up. Mom agreed with the plans made today.  The medication list was reviewed and reconciled.  No changes were made in the prescribed medications today.  A complete medication list was provided to the patient's mother.  Allergies as of 01/31/2017   No Known Allergies     Medication List        Accurate as of 01/31/17 11:59 PM. Always use your most recent med list.  aspirin EC 81 MG tablet Take 1 tablet (81 mg total) by mouth daily.   [name] 300 MG 12 hr capsule Generic drug:  carbamazepine Take 2 capsules (600 mg total) by mouth 2 (two) times daily.   glucose blood test strip Use as instructed   [name] 500 MG tablet Commonly known as:  KEPPRA TAKE 1 TABLET BY MOUTH TWICE DAILY   lisinopril 20 MG tablet Commonly known as:   PRINIVIL,ZESTRIL Take 1 tablet (20 mg total) daily by mouth.   metFORMIN 1000 MG tablet Commonly known as:  GLUCOPHAGE Take 1 tablet by mouth in morning with meal and 1/2 tablet by mouth in evening with meal   metoprolol succinate 100 MG 24 hr tablet Commonly known as:  TOPROL-XL Take 1 tablet (100 mg total) daily by mouth. with food   metoprolol succinate 100 MG 24 hr tablet Commonly known as:  TOPROL-XL TAKE 1 TABLET BY MOUTH DAILY WITH FOOD   onetouch ultrasoft lancets Use as instructed       Total time spent with the patient was 15 minutes, of which 50% or more was spent in counseling and coordination of care.   Elveria Rising NP-C

## 2017-02-01 ENCOUNTER — Encounter (INDEPENDENT_AMBULATORY_CARE_PROVIDER_SITE_OTHER): Payer: Self-pay | Admitting: Family

## 2017-02-01 DIAGNOSIS — Z982 Presence of cerebrospinal fluid drainage device: Secondary | ICD-10-CM | POA: Insufficient documentation

## 2017-02-01 DIAGNOSIS — F819 Developmental disorder of scholastic skills, unspecified: Secondary | ICD-10-CM | POA: Insufficient documentation

## 2017-02-01 NOTE — Patient Instructions (Addendum)
Thank you for bringing Katelyn [name] in today.   Instructions for you until your next appointment are as follows: 1. Continue giving [name] and [name] as you have been giving them.  2. Let me know if she has any seizures or if you have any concerns.  3. Please sign up for MyChart if you have not done so 4. Please plan to return for follow up in 6 months or sooner if needed.

## 2017-02-02 ENCOUNTER — Other Ambulatory Visit: Payer: Self-pay | Admitting: Family Medicine

## 2017-02-26 ENCOUNTER — Other Ambulatory Visit: Payer: Self-pay | Admitting: Family Medicine

## 2017-02-27 ENCOUNTER — Other Ambulatory Visit: Payer: Self-pay

## 2017-02-27 ENCOUNTER — Encounter: Payer: Self-pay | Admitting: Family Medicine

## 2017-02-27 ENCOUNTER — Ambulatory Visit (INDEPENDENT_AMBULATORY_CARE_PROVIDER_SITE_OTHER): Payer: Medicare Other | Admitting: Family Medicine

## 2017-02-27 VITALS — BP 122/70 | HR 122 | Temp 97.7°F | Ht 64.0 in | Wt 166.0 lb

## 2017-02-27 DIAGNOSIS — I10 Essential (primary) hypertension: Secondary | ICD-10-CM

## 2017-02-27 DIAGNOSIS — E119 Type 2 diabetes mellitus without complications: Secondary | ICD-10-CM

## 2017-02-27 DIAGNOSIS — Z0000 Encounter for general adult medical examination without abnormal findings: Secondary | ICD-10-CM

## 2017-02-27 LAB — POCT GLYCOSYLATED HEMOGLOBIN (HGB A1C): Hemoglobin A1C: 6.3

## 2017-02-27 NOTE — Assessment & Plan Note (Signed)
  Mother declines Pap due to discomfort.  Eye appointment scheduled for next month.

## 2017-02-27 NOTE — Patient Instructions (Signed)
[facility]  [address] [place], [place] [id number] Get Directions  [phone] Phone  [facility] programs offer a range of services to individuals with developmental and intellectual disabilities, substance use and mental health disorders.  We are committed to helping each individual accomplish skills that may have seemed impossible to some utilizing person-centered plans.  We focus on the interests of each individual we support to ensure their well-being, dignity and right of choice.  Individuals can select to participate in a collection of programs and resources including:  Art Programs: painting, Haematologistpottery, Location managerdrawing and craft projects Bilingual Services Community Employment Services Compensatory Education Classes [employer]ul[employer] Arts Program Health & Wellness Programs: nutrition, exercise and yoga Horticulture Program Indoor/Outdoor Recreation Activities Indoor/Outdoor Sporting Activities Music Therapy Sensory Room to promote relaxation and social interaction In addition to the [facility] offers the following services:  Services to individuals with Traumatic Brain Injury (TBI) Individuals with TBI have the option of experiencing the programs and services of [facility] with the assistance of [facility] staff that have been trained to support individuals  with TBI.  Community Living Supports Provides support and supervision to help the individual  live successfully in the home of his/her family or natural support, and aids the individual in becoming an active member of his/her community.  Community Navigator A service that provides support to I/DD individuals and their families by coordinating the use of Medicaid resources to link to needed local resources.  Respite Services Offers support to individuals and their primary caregivers by providing periodic in-home supports from skilled professionals.

## 2017-02-27 NOTE — Progress Notes (Signed)
    Subjective:    Patient ID: Katelyn AlanisJeelisha M [name], female    DOB: [birth year], 32 y.o.   MRN: [id number]   CC: follow up A1C, HTN  DM Metformin at last visit was decreased from 1000 to 500mg  daily. Katelyn [name] has been doing well with this change. No feelings of shakiness, confusion, lethargy. No increased thirst, hunger, or urination. Mom works to control portion to help with DM. Katelyn [name] mostly drinks water.  HTN Lisinopril decreased to 20 mg daily at last visit. She is doing well. No CP, SOB.  Health maintenance Mom declines pap as last time this was done it was very painful for her. Katelyn [name] is not sexually active.  Mom reports she is looking for day programs for Katelyn [name] to get out of the house and socialize, also to give mom a rest from her caretaker role. They do not have a community Child psychotherapistsocial worker.  Smoking status reviewed- non-smoker  Review of Systems- see HPI   Objective:  BP 122/70 (BP Location: Left Arm, Patient Position: Sitting, Cuff Size: Normal)   Pulse (!) 122   Temp 97.7 F (36.5 C) (Oral)   Ht 5\' 4"  (1.626 m)   Wt 166 lb (75.3 kg)   LMP 02/15/2017 (Exact Date)   SpO2 98%   BMI 28.49 kg/m  Vitals and nursing note reviewed  General: well nourished, in no acute distress Cardiac: RRR, clear S1 and S2, no murmurs, rubs, or gallops Respiratory: clear to auscultation bilaterally, no increased work of breathing Extremities: no edema or cyanosis. Skin: warm and dry, no rashes noted  Neuro: slowed speech, normal gait   Assessment & Plan:    HTN (hypertension)  Chronic, well controlled. BP at goal today <140/90 Continue current regimen Follow up 6 months  Diabetes (HCC)  Chronic, well contrlled. A1C today 6.2. DM foot exam done today. Pt has appointment with ophthalmology next month. Continue current regimen Follow up 6 months  Routine adult health maintenance  Mother declines Pap due to discomfort.  Eye appointment scheduled for next  month.    Return in about 6 months (around 08/27/2017).   Dolores PattyAngela Duchess Armendarez, DO Family Medicine Resident PGY-2

## 2017-02-27 NOTE — Assessment & Plan Note (Signed)
  Chronic, well controlled. BP at goal today <140/90 Continue current regimen Follow up 6 months

## 2017-02-27 NOTE — Assessment & Plan Note (Addendum)
  Chronic, well contrlled. A1C today 6.2. DM foot exam done today. Pt has appointment with ophthalmology next month. Continue current regimen Follow up 6 months

## 2017-02-28 ENCOUNTER — Telehealth: Payer: Self-pay | Admitting: Licensed Clinical Social Worker

## 2017-02-28 NOTE — Progress Notes (Signed)
Type of Service: Clinical Social Work  Social work consult from Dr. Wonda Oldsiccio reference  Patient and mother wanting resources for a day program.   LCSW called patient's mother Katelyn [name] to assess the need.  Patient has not participated in any day programs.  She spends time with her mother every day.  Mother wants patient to attend a program that will get her connected with other people.  LCSW reviewed and e-mailed community resources to patient's mother Clinical cytogeneticist([facility], [facility] [address], [facility], [place] and [facility].  She was appreciative of the call and information provided.  Updated provided to PCP via in-basket.   Plan: Patient's mother will contact resources provided   Katelyn Hineseborah Jamaul Heist, LCSW Licensed Clinical Social Worker [facility]   [phone] 3:47 PM

## 2017-05-26 LAB — HM DIABETES EYE EXAM

## 2017-05-29 ENCOUNTER — Telehealth (INDEPENDENT_AMBULATORY_CARE_PROVIDER_SITE_OTHER): Payer: Self-pay | Admitting: Pediatrics

## 2017-05-29 DIAGNOSIS — G40909 Epilepsy, unspecified, not intractable, without status epilepticus: Secondary | ICD-10-CM

## 2017-05-29 MED ORDER — CARBATROL 300 MG PO CP12: 600.0000 mg | ORAL_CAPSULE | Freq: Two times a day (BID) | ORAL | 5 refills | Status: DC

## 2017-05-29 NOTE — Telephone Encounter (Signed)
I called Katelyn [name]'s mother and asked why she wanted to switch to generic. Mom said that she didn't want to switch, that she made a mistake on the questions when she did the automated system to refill the medication. I will send in a new Rx for brand [name] as she has been taking. TG

## 2017-05-29 NOTE — Telephone Encounter (Signed)
Patient is requesting that we change the prescription for [name] to the generic brand.

## 2017-06-06 ENCOUNTER — Other Ambulatory Visit: Payer: Self-pay | Admitting: Family Medicine

## 2017-06-08 ENCOUNTER — Encounter: Payer: Self-pay | Admitting: Family Medicine

## 2017-06-08 ENCOUNTER — Other Ambulatory Visit: Payer: Self-pay | Admitting: Family Medicine

## 2017-07-18 ENCOUNTER — Other Ambulatory Visit: Payer: Self-pay | Admitting: Family Medicine

## 2017-08-03 ENCOUNTER — Telehealth: Payer: Self-pay | Admitting: Family Medicine

## 2017-08-03 NOTE — Telephone Encounter (Signed)
LVM to schedule f/u appt. Please assist in do this

## 2017-08-31 DIAGNOSIS — I10 Essential (primary) hypertension: Secondary | ICD-10-CM | POA: Diagnosis not present

## 2017-08-31 DIAGNOSIS — E119 Type 2 diabetes mellitus without complications: Secondary | ICD-10-CM | POA: Diagnosis not present

## 2017-08-31 DIAGNOSIS — G40909 Epilepsy, unspecified, not intractable, without status epilepticus: Secondary | ICD-10-CM | POA: Diagnosis not present

## 2017-08-31 DIAGNOSIS — G919 Hydrocephalus, unspecified: Secondary | ICD-10-CM | POA: Diagnosis not present

## 2017-08-31 DIAGNOSIS — R000 Tachycardia, unspecified: Secondary | ICD-10-CM | POA: Diagnosis not present

## 2017-08-31 DIAGNOSIS — Z79899 Other long term (current) drug therapy: Secondary | ICD-10-CM | POA: Diagnosis not present

## 2017-12-05 ENCOUNTER — Other Ambulatory Visit: Payer: Self-pay | Admitting: Family Medicine

## 2018-01-14 ENCOUNTER — Other Ambulatory Visit: Payer: Self-pay | Admitting: Family Medicine

## 2018-02-17 ENCOUNTER — Other Ambulatory Visit (INDEPENDENT_AMBULATORY_CARE_PROVIDER_SITE_OTHER): Payer: Self-pay | Admitting: Family

## 2018-02-17 DIAGNOSIS — G40909 Epilepsy, unspecified, not intractable, without status epilepticus: Secondary | ICD-10-CM

## 2018-02-20 ENCOUNTER — Telehealth (INDEPENDENT_AMBULATORY_CARE_PROVIDER_SITE_OTHER): Payer: Self-pay | Admitting: Pediatrics

## 2018-02-20 DIAGNOSIS — G40909 Epilepsy, unspecified, not intractable, without status epilepticus: Secondary | ICD-10-CM

## 2018-02-20 MED ORDER — CARBATROL 300 MG PO CP12: 600.0000 mg | ORAL_CAPSULE | Freq: Two times a day (BID) | ORAL | 5 refills | Status: DC

## 2018-02-21 NOTE — Telephone Encounter (Signed)
Rx has been faxed to the pharmacy 

## 2018-04-15 ENCOUNTER — Other Ambulatory Visit: Payer: Self-pay | Admitting: Family Medicine

## 2018-04-30 ENCOUNTER — Telehealth (INDEPENDENT_AMBULATORY_CARE_PROVIDER_SITE_OTHER): Payer: Self-pay | Admitting: Family

## 2018-04-30 DIAGNOSIS — G40909 Epilepsy, unspecified, not intractable, without status epilepticus: Secondary | ICD-10-CM

## 2018-04-30 MED ORDER — LEVETIRACETAM 500 MG PO TABS: 500.0000 mg | ORAL_TABLET | Freq: Two times a day (BID) | ORAL | 0 refills | Status: DC

## 2018-04-30 NOTE — Telephone Encounter (Signed)
Rx has been sent electronically. 

## 2018-05-03 ENCOUNTER — Encounter (INDEPENDENT_AMBULATORY_CARE_PROVIDER_SITE_OTHER): Payer: Self-pay | Admitting: Family

## 2018-05-03 ENCOUNTER — Ambulatory Visit (INDEPENDENT_AMBULATORY_CARE_PROVIDER_SITE_OTHER): Payer: Medicare Other | Admitting: Family

## 2018-05-03 ENCOUNTER — Other Ambulatory Visit: Payer: Self-pay

## 2018-05-03 VITALS — Wt 151.0 lb

## 2018-05-03 DIAGNOSIS — Q039 Congenital hydrocephalus, unspecified: Secondary | ICD-10-CM

## 2018-05-03 DIAGNOSIS — F819 Developmental disorder of scholastic skills, unspecified: Secondary | ICD-10-CM

## 2018-05-03 DIAGNOSIS — Q043 Other reduction deformities of brain: Secondary | ICD-10-CM | POA: Diagnosis not present

## 2018-05-03 DIAGNOSIS — Z982 Presence of cerebrospinal fluid drainage device: Secondary | ICD-10-CM

## 2018-05-03 DIAGNOSIS — G40909 Epilepsy, unspecified, not intractable, without status epilepticus: Secondary | ICD-10-CM

## 2018-05-03 MED ORDER — LEVETIRACETAM 500 MG PO TABS: 500.0000 mg | ORAL_TABLET | Freq: Two times a day (BID) | ORAL | 5 refills | Status: DC

## 2018-05-03 MED ORDER — CARBATROL 300 MG PO CP12: 600.0000 mg | ORAL_CAPSULE | Freq: Two times a day (BID) | ORAL | 5 refills | Status: DC

## 2018-05-03 NOTE — Progress Notes (Signed)
This is a Pediatric Specialist E-Visit follow up consult provided via Telephone Katelyn [name] and her mother Katelyn [name] consented to an E-Visit consult today.  Location of patient: Katelyn [name] is at home Location of provider: Damita Dunningsina Tamera Pingley,NP-C is at office Patient was referred by Tillman Sersiccio, Angela C, DO   The following participants were involved in this E-Visit: CMA, Mom, patient and NP  Chief Complain/ Reason for E-Visit today: seizure follow up Total time on call: 9 min Follow up: 1 year     Katelyn [name]   MRN:  [id number]  March 21, 1985   Provider: Elveria Risingina Marvin Maenza NP-C [facility]  Visit type: Revisit  Last visit: 01/31/2017  Referral source: Milderd MeagerJazma Y. 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 She has focal motor seizures, intellectual delay and insomnia. She is taking and tolerating [name] and [name] for her seizure disorder, and has remained seizure free since March 2016, after adding [name] to her regimen in February 2016  Today's concerns: Mom reports today that Katelyn [name] has been doing well since her last visit. They spent a good part of last year with family in [place] and enjoyed their time there. Mom works with Katelyn [name] to consume a healthy diet as she is now taking Metformin for diabetes. Mom tells me that Katelyn [name] was weighed this week and has lost weight since her last visit. She has had problems in the past with insomnia but Mom says that has improved. Katelyn [name] has been otherwise generally healthy and Mom has no other health concerns for her today other  than previously mentioned.  Review of systems: Please see HPI for neurologic and other pertinent review of systems. Otherwise all other systems were reviewed and were negative.  Problem List: Patient Active Problem List   Diagnosis Date Noted  . Intellectual delay 02/01/2017  . S/P ventriculoperitoneal shunt 02/01/2017  . Hyperlipidemia 06/06/2016  . Anemia 06/06/2016  . Keloid 07/24/2015  . Congenital reduction deformities of brain (HCC) 10/03/2012  . Obesity 06/02/2012  . Routine adult health maintenance 04/21/2012  . HTN (hypertension) 02/13/2012  . Diabetes (HCC) 02/13/2012  . Congenital hydrocephalus (HCC) 02/05/2012  . Seizure disorder (HCC) 02/05/2012       Past Medical History:  Diagnosis Date  . Agenesis of corpus callosum (HCC)   . Congenital hydrocephalus (HCC)    has VP shunt  . Diabetes mellitus without complication (HCC)   . Hypertension   . Insomnia   . Intellectual delay   . Seizures (HCC)     Past medical history comments: See HPI   Surgical history: Past Surgical History:  Procedure Laterality Date  . ORIF ANKLE FRACTURE Left 08/30/2016   Procedure: OPEN REDUCTION INTERNAL FIXATION (ORIF) ANKLE FRACTURE;  Surgeon: Sheral ApleyMurphy, Timothy D, MD;  Location: [facility];  Service: Orthopedics;  Laterality: Left;  . SHUNT REPLACEMENT  2011   Was readmitted 1 week later with peritoneal infection from shunt  . WISDOM TOOTH EXTRACTION       Family history: family history includes Cancer in her maternal grandmother and paternal grandfather; Diabetes in her father, maternal grandmother, and maternal uncle; Heart disease in her maternal grandfather; Hypertension in her father and mother; Thyroid  disease in her maternal grandmother.   Social history: Social History   Socioeconomic History  . Marital status: Single    Spouse name: Not on file  . Number of children: Not on file  . Years of education: Not on file  . Highest education level: Not on  file  Occupational History  . Not on file  Social Needs  . Financial resource strain: Not on file  . Food insecurity:    Worry: Not on file    Inability: Not on file  . Transportation needs:    Medical: Not on file    Non-medical: Not on file  Tobacco Use  . Smoking status: Never Smoker  . Smokeless tobacco: Never Used  Substance and Sexual Activity  . Alcohol use: No  . Drug use: No  . Sexual activity: Never    Birth control/protection: Abstinence  Lifestyle  . Physical activity:    Days per week: Not on file    Minutes per session: Not on file  . Stress: Not on file  Relationships  . Social connections:    Talks on phone: Not on file    Gets together: Not on file    Attends religious service: Not on file    Active member of club or organization: Not on file    Attends meetings of clubs or organizations: Not on file    Relationship status: Not on file  . Intimate partner violence:    Fear of current or ex partner: Not on file    Emotionally abused: Not on file    Physically abused: Not on file    Forced sexual activity: Not on file  Other Topics Concern  . Not on file  Social History Narrative   Naraya:   Lives with mother.   Denies pets.   Denies smoke exposure   Denies tobacco, drugs, or alcohol use   Hobbies: Mostly listens to music, does lots of writing, activity books, goes shopping   Went to [employer] school until 34 years old.   Brushes own teeth; mom helps her bathe; fixes own drinks; mom prepares meals - takes care of self and mom helps.   Denies sexual relationship.   Denies concern for STDs.   Caffeine none         Allergies: No Known Allergies    Immunizations: Immunization History  Administered Date(s) Administered  . Influenza Split 02/13/2012  . Influenza,inj,Quad PF,6+ Mos 10/26/2012, 12/04/2013, 10/10/2014, 12/23/2015, 11/29/2016  . Pneumococcal Polysaccharide-23 10/26/2012  . Tdap 06/01/2012    Physical Exam: Wt 151 lb (68.5 kg)    BMI 25.92 kg/m  Parent report Wt Readings from Last 3 Encounters:  05/03/18 151 lb (68.5 kg)  02/27/17 166 lb (75.3 kg)  01/31/17 164 lb 12.8 oz (74.8 kg)     Impression: 1. Generalized convulsive seizures 2. Focal motor seizures 3. Complex cranial malformation with dorsal third ventricle cyst, agenesis of corpus callosum, oxycephaly, plagiocephaly, partial fusion of the thalamus, enlarged central monoventricle with normal third and fourth ventricles suggesting semilobar holoprosencephaly 4. VP shunt 5. Intellectual delay   Recommendations for plan of care: The patient's previus [facility] records were reviewed. Diksha has neither had nor required imaging or lab studies since the last visit. She is a 33 year old woman with seizure disorder, complex brain malformation as described above, VP shunt and intellectual delay. She is taking and tolerating [name] and [name] and has remained seizure free since March 2016. She will continue her medication without change  for now. I asked Mom to let me know if Creedence has any seizures. I commended Mom for helping Sallyann to lose some weight and to consume a healthy diet. I will see [place] back in follow up in 1 year or sooner if needed. Mom agreed with the plans made today.   The medication list was reviewed and reconciled. No changes were made in the prescribed medications today. A complete medication list was provided to the patient.  Allergies as of 05/03/2018   No Known Allergies     Medication List       Accurate as of May 03, 2018  4:35 PM. Always use your most recent med list.        aspirin EC 81 MG tablet Take 1 tablet (81 mg total) by mouth daily.   [name] 300 MG 12 hr capsule Generic drug:  carbamazepine Take 2 capsules (600 mg total) by mouth 2 (two) times daily.   FeroSul 325 (65 FE) MG tablet Generic drug:  ferrous sulfate TK 1 T PO  BID   glucose blood test strip Use as instructed   [name] 500  MG tablet Commonly known as:  KEPPRA Take 1 tablet (500 mg total) by mouth 2 (two) times daily.   lisinopril 20 MG tablet Commonly known as:  ZESTRIL TAKE 1 TABLET(20 MG) BY MOUTH DAILY   metFORMIN 1000 MG tablet Commonly known as:  GLUCOPHAGE TAKE 1 TABLET(1000 MG) BY MOUTH TWICE DAILY WITH A MEAL   metoprolol succinate 100 MG 24 hr tablet Commonly known as:  TOPROL-XL Take 1 tablet (100 mg total) daily by mouth. with food   metoprolol succinate 100 MG 24 hr tablet Commonly known as:  TOPROL-XL TAKE 1 TABLET BY MOUTH DAILY WITH FOOD   onetouch ultrasoft lancets Use as instructed       Total time spent on the phone with the patient was 9 minutes, of which 50% or more was spent in counseling and coordination of care.   Elveria Rising NP-C [facility] Ph. [phone] Fax [phone]

## 2018-05-03 NOTE — Patient Instructions (Signed)
Thank you for meeting with me by phone today.   Instructions for you until your next appointment are as follows: 1. Continue the seizure medicines as you have been taking them.  2. Let me know if Mishelle has any seizures 3. You are doing a great job with helping Alaina to manage her weight! 4. Please sign up for MyChart if you have not done so 5. Please plan to return for follow up in one year or sooner if needed.

## 2018-07-14 ENCOUNTER — Other Ambulatory Visit: Payer: Self-pay | Admitting: Family Medicine

## 2018-10-11 ENCOUNTER — Other Ambulatory Visit: Payer: Self-pay | Admitting: *Deleted

## 2018-10-11 MED ORDER — METOPROLOL SUCCINATE ER 100 MG PO TB24: 100.0000 mg | ORAL_TABLET | Freq: Every day | ORAL | 0 refills | Status: AC

## 2018-10-11 NOTE — Telephone Encounter (Signed)
Refill sent. Please have pt make appt to f/u for diabetes and HTN. Thanks!

## 2018-10-11 NOTE — Telephone Encounter (Signed)
Appt made for 11-05-2018. Katelyn [name],CMA

## 2018-11-05 ENCOUNTER — Ambulatory Visit: Payer: Medicare Other | Admitting: Family Medicine

## 2018-11-21 ENCOUNTER — Other Ambulatory Visit: Payer: Self-pay

## 2018-11-21 DIAGNOSIS — Z20822 Contact with and (suspected) exposure to covid-19: Secondary | ICD-10-CM

## 2018-11-22 LAB — NOVEL CORONAVIRUS, NAA: SARS-CoV-2, NAA: NOT DETECTED

## 2018-12-17 ENCOUNTER — Other Ambulatory Visit: Payer: Self-pay

## 2018-12-17 DIAGNOSIS — Z20822 Contact with and (suspected) exposure to covid-19: Secondary | ICD-10-CM

## 2018-12-18 LAB — NOVEL CORONAVIRUS, NAA: SARS-CoV-2, NAA: DETECTED — AB

## 2018-12-19 ENCOUNTER — Telehealth: Payer: Self-pay | Admitting: Nurse Practitioner

## 2018-12-19 NOTE — Telephone Encounter (Signed)
Called to Discuss with patient about Covid symptoms and the use of bamlanivimab, a monoclonal antibody infusion for those with mild to moderate Covid symptoms and at a high risk of hospitalization.     Pt is qualified for this infusion at the [facility] infusion center due to co-morbid conditions and/or a member of an at-risk group.    Patient is currently being managed for the following problems: Patient Active Problem List   Diagnosis Date Noted  . Intellectual delay 02/01/2017  . S/P ventriculoperitoneal shunt 02/01/2017  . Hyperlipidemia 06/06/2016  . Anemia 06/06/2016  . Keloid 07/24/2015  . 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 Obesity 06/02/2012  . Routine adult health maintenance 04/21/2012  . HTN (hypertension) 02/13/2012  . Diabetes ([place]) 02/13/2012  . Congenital hydrocephalus ([place]) 02/05/2012  . Seizure disorder ([place]) 02/05/2012    Patient would like to think about it and call us back if she decides to go forward with this treatment.

## 2018-12-21 ENCOUNTER — Other Ambulatory Visit: Payer: Self-pay | Admitting: Nurse Practitioner

## 2018-12-21 DIAGNOSIS — U071 COVID-19: Secondary | ICD-10-CM

## 2018-12-21 DIAGNOSIS — E119 Type 2 diabetes mellitus without complications: Secondary | ICD-10-CM

## 2018-12-21 NOTE — Progress Notes (Signed)
  I connected by phone with Katelyn [name] on 12/21/2018 at 10:45 AM to discuss the potential use of an new treatment for mild to moderate COVID-19 viral infection in non-hospitalized patients.  This patient is a 33 y.o. female that meets the FDA criteria for Emergency Use Authorization of bamlanivimab:  Has a (+) direct SARS-CoV-2 viral test result  Has mild or moderate COVID-19   Is ? 32 years of age and weighs ? 40 kg  Is NOT hospitalized due to COVID-19  Is NOT requiring oxygen therapy or requiring an increase in baseline oxygen flow rate due to COVID-19  Is within 10 days of symptom onset  Has at least one of the high risk factor(s) for progression to severe COVID-19 and/or hospitalization as defined in EUA.  Specific high risk criteria : Diabetes   Patient is managed for the following:  Patient Active Problem List   Diagnosis Date Noted  . Intellectual delay 02/01/2017  . S/P ventriculoperitoneal shunt 02/01/2017  . Hyperlipidemia 06/06/2016  . Anemia 06/06/2016  . Keloid 07/24/2015  . 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 Obesity 06/02/2012  . Routine adult health maintenance 04/21/2012  . HTN (hypertension) 02/13/2012  . Diabetes ([place]) 02/13/2012  . Congenital hydrocephalus ([place]) 02/05/2012  . Seizure disorder ([place]) 02/05/2012    I have spoken and communicated the following to the patient or parent/caregiver:  1. FDA has authorized the emergency use of bamlanivimab for the treatment of mild to moderate COVID-19 in adults and pediatric patients with positive results of direct SARS-CoV-2 viral testing who are 9 years of age and older weighing at least 40 kg, and who are at high risk for progressing to severe COVID-19 and/or hospitalization.  2. The significant known and potential risks and benefits of bamlanivimab, and the extent to which such potential risks and benefits are unknown.  3. Information on available alternative treatments and  the risks and benefits of those alternatives, including clinical trials.  4. Patients treated with bamlanivimab should continue to self-isolate and use infection control measures (e.g., wear mask, isolate, social distance, avoid sharing personal items, clean and disinfect "high touch" surfaces, and frequent handwashing) according to CDC guidelines.   5. The patient or parent/caregiver has the option to accept or refuse bamlanivimab.  After reviewing this information with the patient, The patient agreed to proceed with receiving the infusion of bamlanivimab and will be provided a copy of the Fact sheet prior to receiving the infusion.  Fenton Foy 12/21/2018 10:45 AM

## 2018-12-24 ENCOUNTER — Ambulatory Visit (HOSPITAL_COMMUNITY): Admission: RE | Admit: 2018-12-24 | Payer: Medicare Other | Source: Ambulatory Visit

## 2019-03-25 ENCOUNTER — Other Ambulatory Visit: Payer: Self-pay | Admitting: *Deleted

## 2019-03-26 ENCOUNTER — Other Ambulatory Visit: Payer: Self-pay | Admitting: Family Medicine

## 2019-03-26 MED ORDER — METFORMIN HCL 1000 MG PO TABS: ORAL_TABLET | ORAL | 0 refills | Status: DC

## 2019-03-26 NOTE — Telephone Encounter (Signed)
Attempted to call patient but there was no answer and no voicemail available. Katelyn [name],CMA

## 2019-04-01 MED ORDER — METFORMIN HCL 1000 MG PO TABS: ORAL_TABLET | ORAL | 0 refills | Status: DC

## 2019-04-01 NOTE — Addendum Note (Signed)
Addended by: Henri Medal on: 04/01/2019 08:40 AM   Modules accepted: Orders

## 2019-04-01 NOTE — Telephone Encounter (Signed)
Medication sent to different pharmacy per patient request. Katelyn [name]

## 2019-04-15 DIAGNOSIS — E119 Type 2 diabetes mellitus without complications: Secondary | ICD-10-CM | POA: Diagnosis not present

## 2019-04-15 DIAGNOSIS — I10 Essential (primary) hypertension: Secondary | ICD-10-CM | POA: Diagnosis not present

## 2019-04-15 DIAGNOSIS — Q039 Congenital hydrocephalus, unspecified: Secondary | ICD-10-CM | POA: Diagnosis not present

## 2019-05-16 ENCOUNTER — Telehealth (INDEPENDENT_AMBULATORY_CARE_PROVIDER_SITE_OTHER): Payer: Self-pay | Admitting: Family

## 2019-05-16 DIAGNOSIS — G40909 Epilepsy, unspecified, not intractable, without status epilepticus: Secondary | ICD-10-CM

## 2019-05-16 MED ORDER — CARBATROL 300 MG PO CP12: 600.0000 mg | ORAL_CAPSULE | Freq: Two times a day (BID) | ORAL | 0 refills | Status: DC

## 2019-05-16 NOTE — Telephone Encounter (Signed)
Who's calling (name and relationship to patient) : Katelyn [name] mom   Best contact number: [phone]  Provider they see: Elveria Rising  Reason for call: Requesting refill for carbatrol   Call ID:      PRESCRIPTION REFILL ONLY  Name of prescription: carbatrol   Pharmacy:  [facility] [place]

## 2019-05-16 NOTE — Telephone Encounter (Signed)
Rx was faxed to the pharmacy for 1 month supply. I sent a message to the scheduler to schedule an appointment with me. TG

## 2019-05-16 NOTE — Telephone Encounter (Signed)
Please send to the pharmacy. Patient is due for a follow up appointment

## 2019-05-30 ENCOUNTER — Encounter (INDEPENDENT_AMBULATORY_CARE_PROVIDER_SITE_OTHER): Payer: Self-pay | Admitting: Family

## 2019-06-10 IMAGING — DX DG ANKLE COMPLETE 3+V*L*
3 series · 3 of 3 positions shown · non-contrast
Comparison: None.

CLINICAL DATA: Twisting injury after falling down this morning.

EXAM:
LEFT ANKLE COMPLETE - 3+ VIEW

[x ankle ap left]
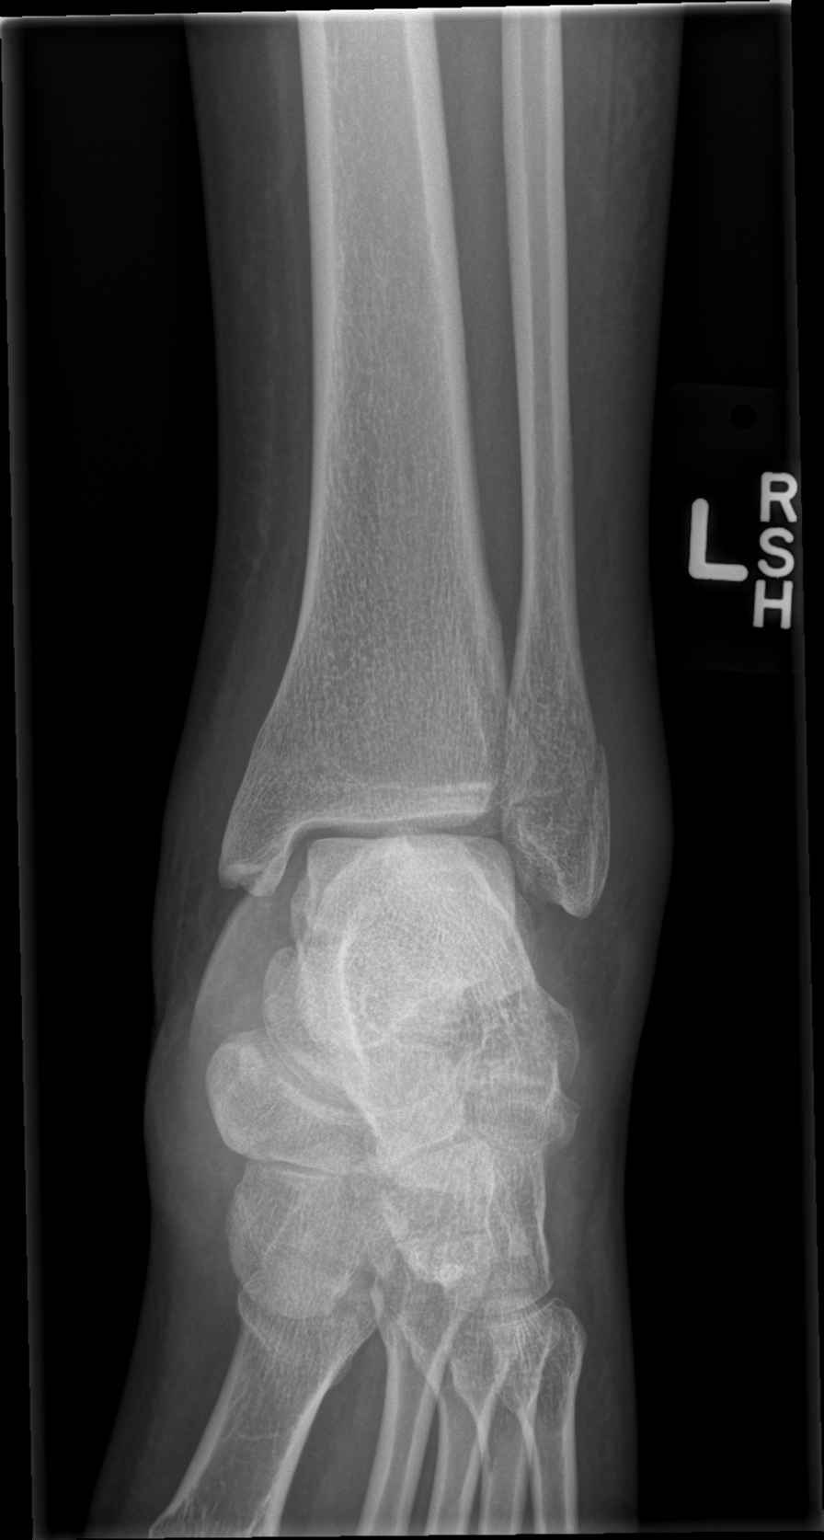

[x ankle obl left]
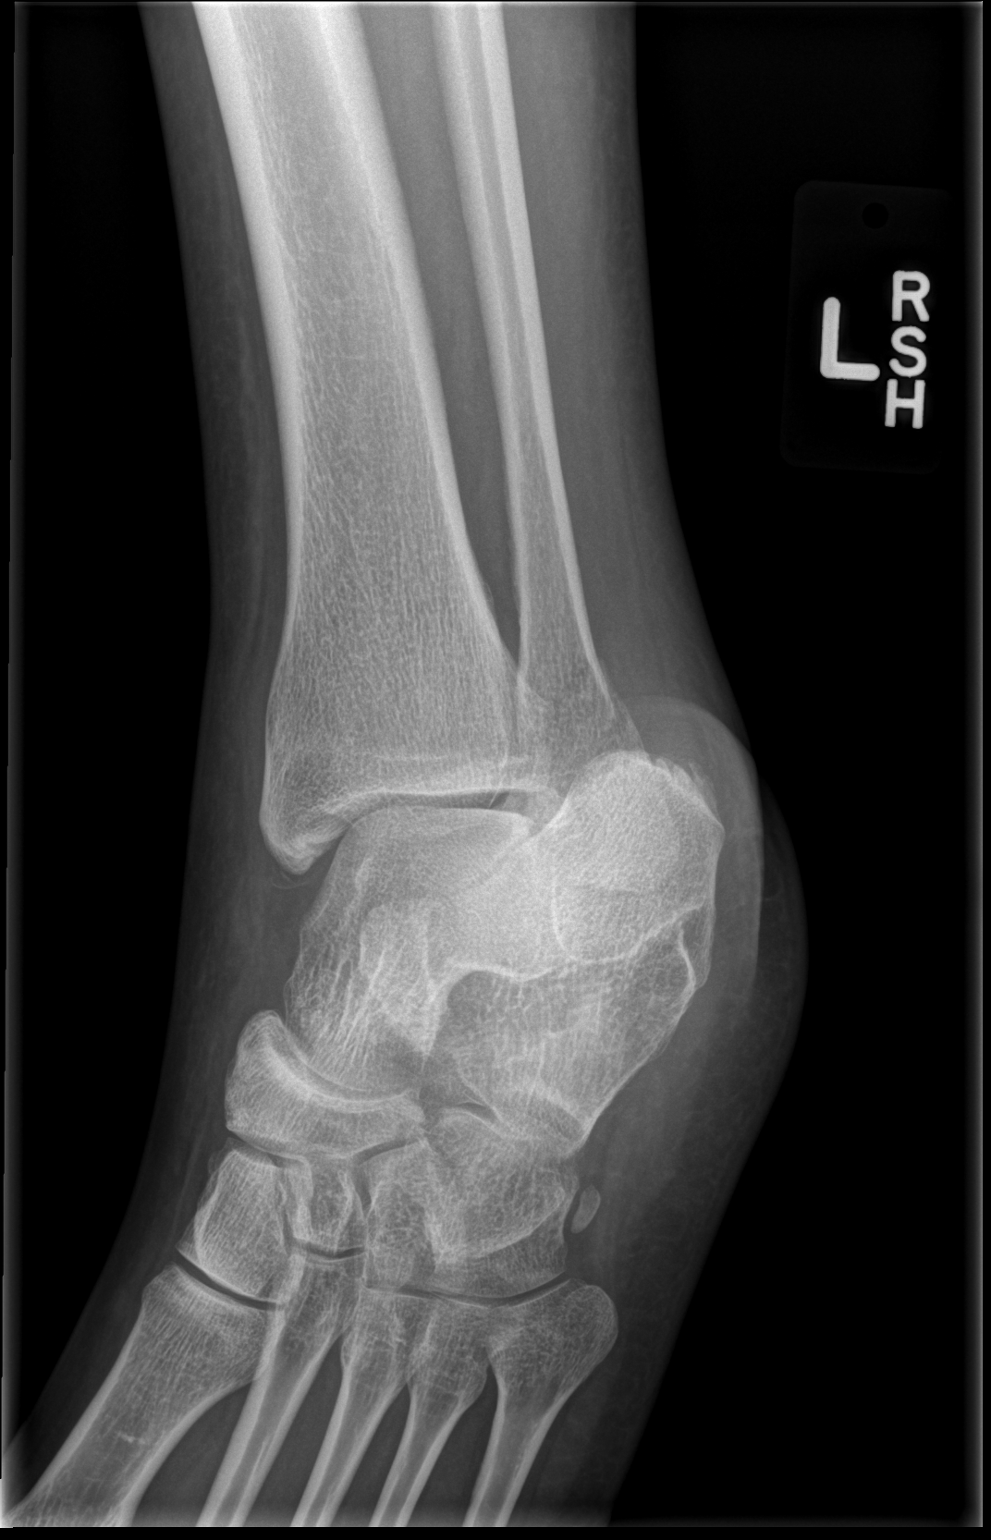

[x ankle lat left]
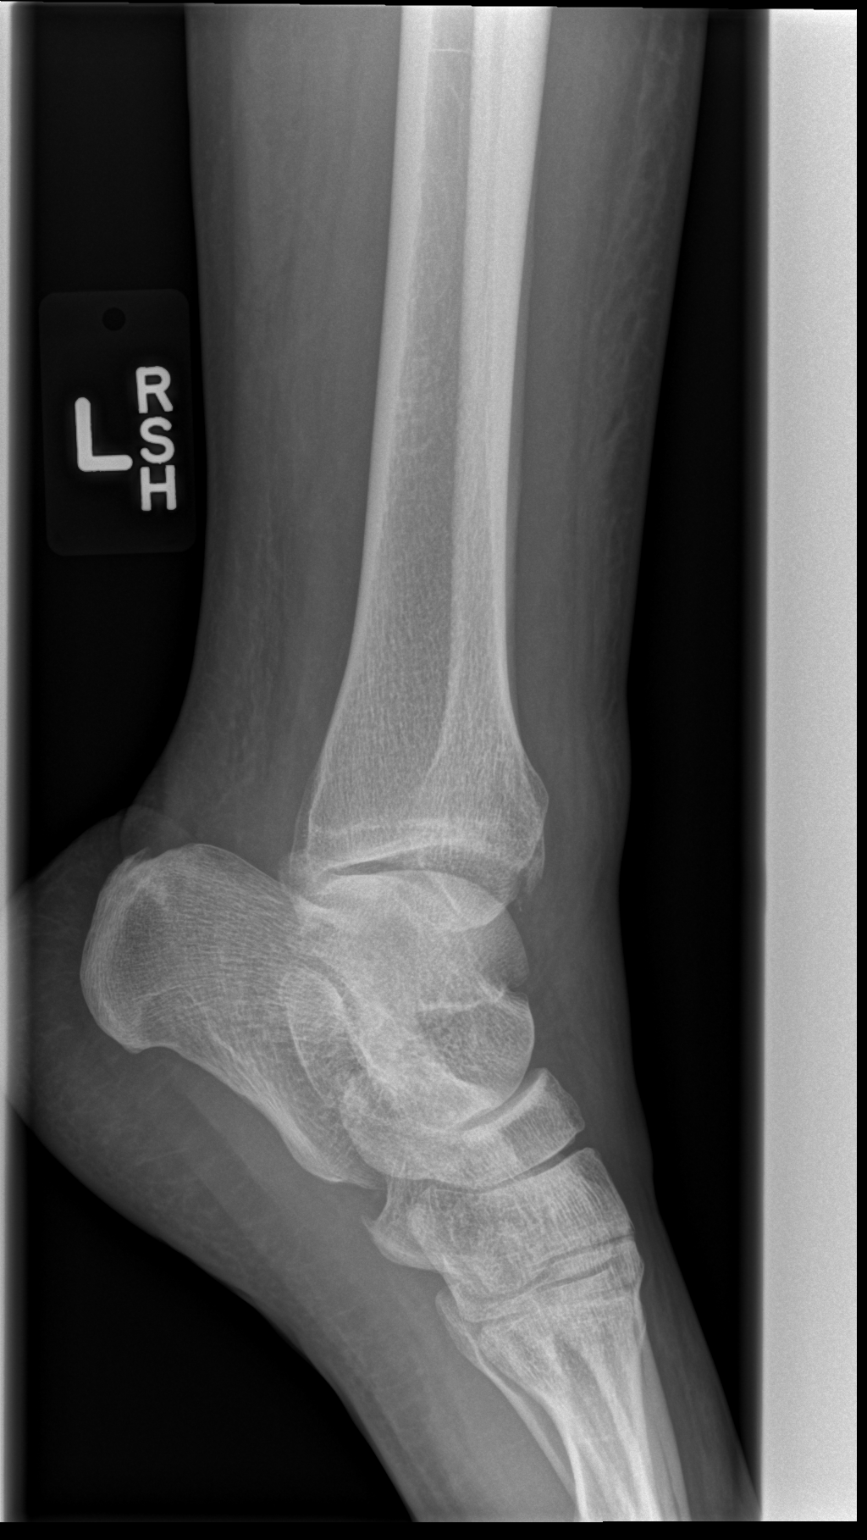

[3 of 3 positions shown; findings below may reference images not displayed]

FINDINGS: There is a nondisplaced fracture of the lateral malleolus. The
fracture likely extends into the syndesmosis. Tiny linear ossific
densities at the tip of the medial malleolus may represent a tiny
avulsion fracture. Small tibiotalar joint effusion. Diffuse soft
tissue swelling about the ankle.

Small plantar enthesophyte.  Os peroneum.
IMPRESSION: 1. Nondisplaced fracture of the lateral malleolus with adjacent soft
tissue swelling.
2. Tiny linear ossific densities at the tip of the medial malleolus
may represent a tiny avulsion fracture.

## 2019-06-26 ENCOUNTER — Telehealth (INDEPENDENT_AMBULATORY_CARE_PROVIDER_SITE_OTHER): Payer: Self-pay | Admitting: Pediatrics

## 2019-06-26 DIAGNOSIS — G40909 Epilepsy, unspecified, not intractable, without status epilepticus: Secondary | ICD-10-CM

## 2019-06-26 MED ORDER — LEVETIRACETAM 500 MG PO TABS: 500.0000 mg | ORAL_TABLET | Freq: Two times a day (BID) | ORAL | 0 refills | Status: DC

## 2019-06-26 NOTE — Telephone Encounter (Signed)
Rx has been sent to the pharmacy

## 2019-07-01 ENCOUNTER — Other Ambulatory Visit (INDEPENDENT_AMBULATORY_CARE_PROVIDER_SITE_OTHER): Payer: Self-pay | Admitting: Family

## 2019-07-01 DIAGNOSIS — G40909 Epilepsy, unspecified, not intractable, without status epilepticus: Secondary | ICD-10-CM

## 2019-07-01 MED ORDER — CARBATROL 300 MG PO CP12: 600.0000 mg | ORAL_CAPSULE | Freq: Two times a day (BID) | ORAL | 0 refills | Status: DC

## 2019-07-01 NOTE — Telephone Encounter (Signed)
Please send to the pharmacy °

## 2019-07-02 ENCOUNTER — Telehealth (INDEPENDENT_AMBULATORY_CARE_PROVIDER_SITE_OTHER): Payer: Self-pay | Admitting: Family

## 2019-07-02 NOTE — Telephone Encounter (Signed)
Who's calling (name and relationship to patient) : Katelyn [name] mom   Best contact number: [phone]  Provider they see: Elveria Rising  Reason for call: Requesting a refill for [name] and levetiracetam   Call ID:      PRESCRIPTION REFILL ONLY  Name of prescription: carbatrol and levetiracetam  Pharmacy: [facility] [address]

## 2019-07-02 NOTE — Telephone Encounter (Signed)
Noted, thank you

## 2019-07-02 NOTE — Telephone Encounter (Signed)
Pharmacy staff stated that they did not receive the fax that was sent over yesterday for the patient's [name]. I have resent the rx.

## 2019-08-07 DIAGNOSIS — T1490XA Injury, unspecified, initial encounter: Secondary | ICD-10-CM | POA: Diagnosis not present

## 2019-08-07 DIAGNOSIS — M542 Cervicalgia: Secondary | ICD-10-CM | POA: Diagnosis not present

## 2019-08-13 ENCOUNTER — Other Ambulatory Visit: Payer: Self-pay | Admitting: Family Medicine

## 2019-08-28 ENCOUNTER — Other Ambulatory Visit (INDEPENDENT_AMBULATORY_CARE_PROVIDER_SITE_OTHER): Payer: Self-pay | Admitting: Family

## 2019-08-28 DIAGNOSIS — G40909 Epilepsy, unspecified, not intractable, without status epilepticus: Secondary | ICD-10-CM

## 2019-09-09 ENCOUNTER — Other Ambulatory Visit (INDEPENDENT_AMBULATORY_CARE_PROVIDER_SITE_OTHER): Payer: Self-pay | Admitting: Pediatrics

## 2019-09-09 DIAGNOSIS — G40909 Epilepsy, unspecified, not intractable, without status epilepticus: Secondary | ICD-10-CM

## 2019-09-09 MED ORDER — CARBATROL 300 MG PO CP12: 600.0000 mg | ORAL_CAPSULE | Freq: Two times a day (BID) | ORAL | 0 refills | Status: DC

## 2019-09-09 NOTE — Telephone Encounter (Signed)
Who's calling (name and relationship to patient) : Katelyn [name] (mom)  Best contact number: [phone]  Provider they see: Dr. Sharene Skeans  Reason for call:  Mom called in stating the pharmacy had not received the rx for April's [name]. Please advise mom when sent   Call ID:      PRESCRIPTION REFILL ONLY  Name of prescription: [name]  Pharmacy:  [facility] [phone] - [place], [place] - [address]

## 2019-09-09 NOTE — Telephone Encounter (Signed)
Please send to the pharmacy °

## 2019-09-16 ENCOUNTER — Other Ambulatory Visit: Payer: Self-pay

## 2019-09-16 ENCOUNTER — Ambulatory Visit (INDEPENDENT_AMBULATORY_CARE_PROVIDER_SITE_OTHER): Payer: Medicare Other | Admitting: Family

## 2019-09-17 NOTE — Telephone Encounter (Signed)
I removed her form out the system. Aquilla Solian, CMA

## 2019-09-17 NOTE — Telephone Encounter (Signed)
Spoke with pts mother. She informed me that the patient no longer goes to this practice. She now goes to [facility]. Aquilla Solian, CMA

## 2019-09-26 DIAGNOSIS — Z79899 Other long term (current) drug therapy: Secondary | ICD-10-CM | POA: Diagnosis not present

## 2019-09-26 DIAGNOSIS — Z0001 Encounter for general adult medical examination with abnormal findings: Secondary | ICD-10-CM | POA: Diagnosis not present

## 2019-09-26 DIAGNOSIS — Z23 Encounter for immunization: Secondary | ICD-10-CM | POA: Diagnosis not present

## 2019-09-26 DIAGNOSIS — E119 Type 2 diabetes mellitus without complications: Secondary | ICD-10-CM | POA: Diagnosis not present

## 2019-09-26 DIAGNOSIS — E559 Vitamin D deficiency, unspecified: Secondary | ICD-10-CM | POA: Diagnosis not present

## 2019-09-26 DIAGNOSIS — D509 Iron deficiency anemia, unspecified: Secondary | ICD-10-CM | POA: Diagnosis not present

## 2019-09-26 DIAGNOSIS — J309 Allergic rhinitis, unspecified: Secondary | ICD-10-CM | POA: Diagnosis not present

## 2019-09-30 ENCOUNTER — Ambulatory Visit (INDEPENDENT_AMBULATORY_CARE_PROVIDER_SITE_OTHER): Payer: Medicare Other | Admitting: Family

## 2019-09-30 ENCOUNTER — Other Ambulatory Visit: Payer: Self-pay

## 2019-09-30 ENCOUNTER — Encounter (INDEPENDENT_AMBULATORY_CARE_PROVIDER_SITE_OTHER): Payer: Self-pay | Admitting: Family

## 2019-09-30 VITALS — BP 104/62 | HR 92 | Ht 63.0 in | Wt 152.2 lb

## 2019-09-30 DIAGNOSIS — Q043 Other reduction deformities of brain: Secondary | ICD-10-CM | POA: Diagnosis not present

## 2019-09-30 DIAGNOSIS — Z982 Presence of cerebrospinal fluid drainage device: Secondary | ICD-10-CM | POA: Diagnosis not present

## 2019-09-30 DIAGNOSIS — Q039 Congenital hydrocephalus, unspecified: Secondary | ICD-10-CM

## 2019-09-30 DIAGNOSIS — G40909 Epilepsy, unspecified, not intractable, without status epilepticus: Secondary | ICD-10-CM | POA: Diagnosis not present

## 2019-09-30 DIAGNOSIS — F819 Developmental disorder of scholastic skills, unspecified: Secondary | ICD-10-CM

## 2019-09-30 NOTE — Progress Notes (Signed)
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 She has focal motor seizures, intellectual delay and insomnia. She is taking and tolerating [name] and [name] for her seizure disorder, and has remained seizure free since March 2016, after adding [name] to her regimen in February 2016  Today's concerns: Mom reports today that Katelyn [name] has remained seizure free and has been doing well. She recently returned from visiting family in [place]. Mom notes that Katelyn [name] had Covid infection in December and that she has since been vaccinated. Mom reports that Katelyn [name] was recently seen by her PCP and diagnosed with anemia and with elevated cholesterol. She is working on these problems with improving her diet.   Katelyn [name] has been otherwise generally healthy since she was last seen. Mom has no other health concerns for Katelyn [name] today other than previously mentioned.  Review of systems: Please see HPI for neurologic and other pertinent review of systems. Otherwise all other systems were reviewed and were negative.  Problem List: Patient Active Problem List   Diagnosis Date Noted   Intellectual delay 02/01/2017   S/P ventriculoperitoneal shunt 02/01/2017   Hyperlipidemia 06/06/2016   Anemia 06/06/2016   Keloid 07/24/2015   Congenital reduction deformities of brain ([facility]) 10/03/2012   Obesity  06/02/2012   Routine adult health maintenance 04/21/2012   HTN (hypertension) 02/13/2012   Diabetes ([facility]) 02/13/2012   Congenital hydrocephalus ([facility]) 02/05/2012   Seizure disorder ([facility]) 02/05/2012     Past Medical History:  Diagnosis Date   Agenesis of corpus callosum ([facility])    Congenital hydrocephalus ([facility])    has VP shunt   Diabetes mellitus without complication ([facility])    Hypertension    Insomnia    Intellectual delay    Seizures ([facility])     Past medical history comments: See HPI   Surgical history: Past Surgical History:  Procedure Laterality Date   ORIF ANKLE FRACTURE Left 08/30/2016   Procedure: OPEN REDUCTION INTERNAL FIXATION (ORIF) ANKLE FRACTURE;  Surgeon: Sheral Apley, MD;  Location: [facility];  Service: Orthopedics;  Laterality: Left;   SHUNT REPLACEMENT  2011   Was readmitted 1 week later with peritoneal infection from shunt   WISDOM TOOTH EXTRACTION       Family history: family history includes Cancer in her maternal grandmother and paternal grandfather; Diabetes in her father, maternal grandmother, and maternal uncle; Heart disease in her maternal grandfather; Hypertension in her father and mother; Thyroid disease in her maternal grandmother.   Social history: Social History   Socioeconomic History   Marital status: Single    Spouse name: Not on file   Number of children: Not on file   Years of education: Not on file   Highest education level: Not on file  Occupational History   Not on file  Tobacco Use   Smoking status: Never Smoker   Smokeless tobacco: Never Used  Vaping Use   Vaping Use: Never used  Substance and Sexual Activity   Alcohol use: No  Drug use: No   Sexual activity: Never    Birth control/protection: Abstinence  Other Topics Concern   Not on file  Social History Narrative   Katelyn [name]:   Lives with mother.   Denies pets.   Denies smoke exposure   Denies tobacco, drugs, or alcohol use    Hobbies: Mostly listens to music, does lots of writing, activity books, goes shopping   Went to [employer] until 34 years old.   Brushes own teeth; mom helps her bathe; fixes own drinks; mom prepares meals - takes care of self and mom helps.   Denies sexual relationship.   Denies concern for STDs.   Caffeine none      Social Determinants of Health   Financial Resource Strain:    Difficulty of Paying Living Expenses: Not on file  Food Insecurity:    Worried About Programme researcher, broadcasting/film/video in the Last Year: Not on file   [employer] of Food in the Last Year: Not on file  Transportation Needs:    Lack of Transportation (Medical): Not on file   Lack of Transportation (Non-Medical): Not on file  Physical Activity:    Days of Exercise per Week: Not on file   Minutes of Exercise per Session: Not on file  Stress:    Feeling of Stress : Not on file  Social Connections:    Frequency of Communication with Friends and Family: Not on file   Frequency of Social Gatherings with Friends and Family: Not on file   Attends Religious Services: Not on file   Active Member of Clubs or Organizations: Not on file   Attends Banker Meetings: Not on file   Marital Status: Not on file  Intimate Partner Violence:    Fear of Current or Ex-Partner: Not on file   Emotionally Abused: Not on file   Physically Abused: Not on file   Sexually Abused: Not on file    Past/failed meds:   Allergies: No Known Allergies    Immunizations: Immunization History  Administered Date(s) Administered   Influenza Split 02/13/2012   Influenza,inj,Quad PF,6+ Mos 10/26/2012, 12/04/2013, 10/10/2014, 12/23/2015, 11/29/2016   Pneumococcal Polysaccharide-23 10/26/2012   Tdap 06/01/2012     Diagnostics/Screenings: Copied from previous record: 01/11/2014 - CT head noncontrast - Chronic stable congenital findings as described with a left posterior parietal ventriculostomy catheter unchanged.  The ventriculostomy catheter position is unchanged along the right lateral border of a prominent midline CSF space which appears to communicate with the lateral ventricles as this is overall slightly smaller.  Physical Exam: BP 104/62    Pulse 92    Ht 5\' 3"  (1.6 m)    Wt 152 lb 3.2 oz (69 kg)    BMI 26.96 kg/m   General: well developed, well nourished woman, seated on exam table, in no evident distress; black hair, brown eyes, right handed Head: misshapen head with oxycephaly, plagiocephaly and megencephaly. She has VP shunt and esotropia. Oropharynx benign.  Neck: supple Cardiovascular: regular rate and rhythm, no murmurs. Respiratory: clear to auscultation bilaterally Abdomen: bowel sounds present all four quadrants, abdomen soft, non-tender, non-distended. Musculoskeletal: no skeletal deformities or obvious scoliosis Skin: no rashes or neurocutaneous lesions  Neurologic Exam Mental Status: awake and fully alert. Fund of knowledge very subnormal for age. Speech with some dysarthria. Able to answer a few simple questions but unable to volunteer information. Able to follow simple commands and participate in examination Cranial Nerves: fundoscopic exam - red reflex present.  Unable  to fully visualize fundus.  Pupils equal briskly reactive to light.  Turns to localize faces, objects and sounds in the periphery. Facial movements are symmetric. Motor: normal bulk, tone and strength in all tested extremity muscles Sensory: intact to touch and temperature Coordination: unable to adequately assess due to patient's inability to participate in examination. No dysmetria when reaching for objects. Gait and Station: arises from chair without difficulty, Stance is wide based. Gait has fairly normal stride length and balance. Had difficulty following instructions for heel, toe and tandem walk. Was able to tap her toes fairly well.  Reflexes: diminished and symmetric. Toes neutral. No  clonus  Impression: 1. Generalized convulsive seizures 2. Focal motor seizures 3. 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 VP shunt 5. Intellectual delay  Recommendations for plan of care: The patient's previous [facility] records were reviewed. Katelyn [name] has neither had nor required imaging or lab studies since the last visit. She is a 34 year old woman with history of complex brain malformation as described above, seizures, VP shunt and intellectual delay. She is taking and tolerating [name] and [name] and has remained seizure free since her last visit. She is compliant with medication and usually gets 8 hours of sleep each night. I will see Katelyn [name] back in follow up in 1 year or sooner if needed. Mom agreed with the plans made today.   The medication list was reviewed and reconciled. No changes were made in the prescribed medications today. A complete medication list was provided to the patient.  Allergies as of 09/30/2019   No Known Allergies     Medication List       Accurate as of September 30, 2019 11:59 PM. If you have any questions, ask your nurse or doctor.        aspirin EC 81 MG tablet Take 1 tablet (81 mg total) by mouth daily.   [name] 300 MG 12 hr capsule Generic drug: carbamazepine Take 2 capsules 2 times per day What changed:   how much to take  how to take this  when to take this  additional instructions Changed by: Elveria Rising, NP   FeroSul 325 (65 FE) MG tablet Generic drug: ferrous sulfate TK 1 T PO  BID   glucose blood test strip Use as instructed   [name] 500 MG tablet Commonly known as: KEPPRA TAKE 1 TABLET(500 MG) BY MOUTH TWICE DAILY   lisinopril 20 MG tablet Commonly known as: ZESTRIL TAKE 1  TABLET(20 MG) BY MOUTH DAILY   metFORMIN 1000 MG tablet Commonly known as: GLUCOPHAGE TAKE 1 TABLET BY MOUTH IN THE MORNING AND 1/2 TABLET IN THE EVENING   metoprolol succinate 100 MG 24 hr tablet Commonly known as: TOPROL-XL Take 1 tablet (100 mg total) by mouth daily. with food   onetouch ultrasoft lancets Use as instructed      Total time spent with the patient was 20 minutes, of which 50% or more was spent in counseling and coordination of care.  Elveria Rising NP-C [facility] Child Neurology Ph. [phone] Fax [phone]

## 2019-10-02 ENCOUNTER — Encounter (INDEPENDENT_AMBULATORY_CARE_PROVIDER_SITE_OTHER): Payer: Self-pay | Admitting: Family

## 2019-10-02 MED ORDER — CARBATROL 300 MG PO CP12: ORAL_CAPSULE | ORAL | 3 refills | Status: DC

## 2019-10-02 MED ORDER — LEVETIRACETAM 500 MG PO TABS: ORAL_TABLET | ORAL | 3 refills | Status: DC

## 2019-10-02 NOTE — Patient Instructions (Signed)
Thank you for coming in today.   Instructions for you until your next appointment are as follows: 1. Continue giving Katelyn [name] the [name] and [name] as prescribed 2. Let me know if she has any seizures 3. Continue to follow up with her PCP for her other health problems.  4. Please sign up for MyChart if you have not done so 5. Please plan to return for follow up in one year or sooner if needed.

## 2019-10-23 DIAGNOSIS — E109 Type 1 diabetes mellitus without complications: Secondary | ICD-10-CM | POA: Diagnosis not present

## 2019-10-29 ENCOUNTER — Other Ambulatory Visit (INDEPENDENT_AMBULATORY_CARE_PROVIDER_SITE_OTHER): Payer: Self-pay | Admitting: Family

## 2019-10-29 DIAGNOSIS — G40909 Epilepsy, unspecified, not intractable, without status epilepticus: Secondary | ICD-10-CM

## 2019-11-25 ENCOUNTER — Other Ambulatory Visit: Payer: Self-pay | Admitting: Family Medicine

## 2019-11-26 ENCOUNTER — Other Ambulatory Visit: Payer: Self-pay | Admitting: Family Medicine

## 2020-01-17 ENCOUNTER — Other Ambulatory Visit: Payer: Self-pay | Admitting: Family Medicine

## 2020-01-25 ENCOUNTER — Ambulatory Visit: Payer: Medicare Other | Attending: Internal Medicine

## 2020-01-25 DIAGNOSIS — Z23 Encounter for immunization: Secondary | ICD-10-CM

## 2020-01-25 NOTE — Progress Notes (Signed)
   Covid-19 Vaccination Clinic  Name:  Katelyn [name]    MRN: [id number] DOB: 1985-02-16  01/25/2020  Katelyn [name] was observed post Covid-19 immunization for 15 minutes without incident. She was provided with Vaccine Information Sheet and instruction to access the V-Safe system.   Katelyn [name] was instructed to call 911 with any severe reactions post vaccine: Marland Kitchen Difficulty breathing  . Swelling of face and throat  . A fast heartbeat  . A bad rash all over body  . Dizziness and weakness   Immunizations Administered    Name Date Dose VIS Date Route   Pfizer COVID-19 Vaccine 01/25/2020 10:42 AM 0.3 mL 11/06/2019 Intramuscular   Manufacturer: [employer], [employer]   Lot: [id number]   NDC: [address]-4

## 2020-05-24 ENCOUNTER — Encounter (INDEPENDENT_AMBULATORY_CARE_PROVIDER_SITE_OTHER): Payer: Self-pay

## 2020-10-06 ENCOUNTER — Other Ambulatory Visit (INDEPENDENT_AMBULATORY_CARE_PROVIDER_SITE_OTHER): Payer: Self-pay | Admitting: Family

## 2020-10-06 DIAGNOSIS — G40909 Epilepsy, unspecified, not intractable, without status epilepticus: Secondary | ICD-10-CM

## 2020-10-07 ENCOUNTER — Other Ambulatory Visit (INDEPENDENT_AMBULATORY_CARE_PROVIDER_SITE_OTHER): Payer: Self-pay | Admitting: Family

## 2020-10-07 DIAGNOSIS — G40909 Epilepsy, unspecified, not intractable, without status epilepticus: Secondary | ICD-10-CM

## 2020-10-07 NOTE — Telephone Encounter (Signed)
I printed the prescription and signed it. Asher Muir, Please send or fax the prescription to the pharmacy.

## 2020-10-07 NOTE — Telephone Encounter (Signed)
I sent this to Dr Merri Brunette to print and sign as Brand Medically Necessary. I also sent a message to the scheduler to contact patient for an appointment. TG

## 2020-12-30 ENCOUNTER — Ambulatory Visit (INDEPENDENT_AMBULATORY_CARE_PROVIDER_SITE_OTHER): Payer: Medicare Other | Admitting: Family

## 2021-02-03 ENCOUNTER — Ambulatory Visit (INDEPENDENT_AMBULATORY_CARE_PROVIDER_SITE_OTHER): Payer: Medicaid Other | Admitting: Family

## 2021-02-10 ENCOUNTER — Other Ambulatory Visit (INDEPENDENT_AMBULATORY_CARE_PROVIDER_SITE_OTHER): Payer: Self-pay

## 2021-02-10 DIAGNOSIS — G40909 Epilepsy, unspecified, not intractable, without status epilepticus: Secondary | ICD-10-CM

## 2021-02-10 MED ORDER — CARBATROL 300 MG PO CP12: ORAL_CAPSULE | ORAL | 0 refills | Status: DC

## 2021-03-03 ENCOUNTER — Ambulatory Visit (INDEPENDENT_AMBULATORY_CARE_PROVIDER_SITE_OTHER): Payer: Medicaid Other | Admitting: Family

## 2021-03-22 ENCOUNTER — Ambulatory Visit (INDEPENDENT_AMBULATORY_CARE_PROVIDER_SITE_OTHER): Payer: Medicaid Other | Admitting: Family

## 2021-04-02 ENCOUNTER — Ambulatory Visit
Admission: RE | Admit: 2021-04-02 | Discharge: 2021-04-02 | Disposition: A | Discharge status: Home / Self Care | Payer: Medicare Other | Source: Ambulatory Visit | Attending: Registered Nurse | Admitting: Registered Nurse

## 2021-04-02 ENCOUNTER — Other Ambulatory Visit: Payer: Self-pay | Admitting: Registered Nurse

## 2021-04-02 DIAGNOSIS — D72828 Other elevated white blood cell count: Secondary | ICD-10-CM

## 2021-04-06 ENCOUNTER — Other Ambulatory Visit (HOSPITAL_COMMUNITY): Payer: Self-pay | Admitting: Registered Nurse

## 2021-04-06 DIAGNOSIS — I10 Essential (primary) hypertension: Secondary | ICD-10-CM

## 2021-04-12 ENCOUNTER — Ambulatory Visit (INDEPENDENT_AMBULATORY_CARE_PROVIDER_SITE_OTHER): Payer: Medicare Other | Admitting: Family

## 2021-04-12 ENCOUNTER — Encounter (INDEPENDENT_AMBULATORY_CARE_PROVIDER_SITE_OTHER): Payer: Self-pay | Admitting: Family

## 2021-04-12 ENCOUNTER — Other Ambulatory Visit: Payer: Self-pay

## 2021-04-12 VITALS — BP 112/70 | HR 100 | Wt 162.2 lb

## 2021-04-12 DIAGNOSIS — Q043 Other reduction deformities of brain: Secondary | ICD-10-CM | POA: Diagnosis not present

## 2021-04-12 DIAGNOSIS — Z982 Presence of cerebrospinal fluid drainage device: Secondary | ICD-10-CM

## 2021-04-12 DIAGNOSIS — Q039 Congenital hydrocephalus, unspecified: Secondary | ICD-10-CM

## 2021-04-12 DIAGNOSIS — G40909 Epilepsy, unspecified, not intractable, without status epilepticus: Secondary | ICD-10-CM

## 2021-04-12 DIAGNOSIS — F819 Developmental disorder of scholastic skills, unspecified: Secondary | ICD-10-CM

## 2021-04-12 MED ORDER — LEVETIRACETAM 500 MG PO TABS: ORAL_TABLET | ORAL | 5 refills | Status: DC

## 2021-04-12 MED ORDER — CARBATROL 300 MG PO CP12: ORAL_CAPSULE | ORAL | 3 refills | Status: DC

## 2021-04-12 NOTE — Progress Notes (Signed)
? ?Katelyn [name]   ?MRN:  [id number]  ?1985-07-07  ? ?Provider: Rockwell Germany NP-C ?Location of Care: [facility] ? ?Visit type: Return visit ? ?Last visit: 09/30/2019 ? ?Referral source: Luiz Blare, DO ?History from: Epic chart, patient and her mother ? 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 She has focal motor seizures, intellectual delay and insomnia. She is taking and tolerating brand [name] and generic [name] for her seizure disorder, and has remained seizure free since March 2016, after adding [name] to her regimen in February 2016 ? ?Today's concerns: ?Mom reports today that Valita has been experiencing tachycardia and that she recently wore a heart monitor. The results for that are still pending. She is scheduled to have an echocardiogram in the near future.  ? ?Mom is also concerned about Cecilee's hair becoming thin in places and plans to take her to see a dermatologist to evaluate that.  ? ?Mom reports that [place] had 1 seizure since her last visit. She said that it was brief and did not require intervention.  ? ?Hadeel has been otherwise generally healthy since she was last seen. Her mother has no other health concerns for her today other than previously mentioned. ? ?Review of systems: ?Please see HPI for neurologic and other pertinent review of systems. Otherwise all other systems were reviewed and were negative. ? ?Problem List: ?Patient Active Problem List  ? Diagnosis Date Noted  ? Intellectual delay 02/01/2017  ? S/P ventriculoperitoneal shunt 02/01/2017  ? Hyperlipidemia 06/06/2016  ? Anemia 06/06/2016  ? Keloid 07/24/2015   ? 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 Obesity 06/02/2012  ? Routine adult health maintenance 04/21/2012  ? HTN (hypertension) 02/13/2012  ? Diabetes ([place]) 02/13/2012  ? Congenital hydrocephalus ([place]) 02/05/2012  ? Seizure disorder ([place]) 02/05/2012  ?  ? ?Past Medical History:  ?Diagnosis Date  ? Agenesis of corpus callosum (HCC)   ? Congenital hydrocephalus ([place])   ? has VP shunt  ? Diabetes mellitus without complication ([place])   ? Hypertension   ? Insomnia   ? Intellectual delay   ? Seizures ([place])   ?  ?Past medical history comments: See HPI ? ?Surgical history: ?Past Surgical History:  ?Procedure Laterality Date  ? ORIF ANKLE FRACTURE Left 08/30/2016  ? Procedure: OPEN REDUCTION INTERNAL FIXATION (ORIF) ANKLE FRACTURE;  Surgeon: Renette Butters, MD;  Location: [facility];  Service: Orthopedics;  Laterality: Left;  ? SHUNT REPLACEMENT  2011  ? Was readmitted 1 week later with peritoneal infection from shunt  ? WISDOM TOOTH EXTRACTION    ?  ? ?Family history: ?family history includes Cancer in her maternal grandmother and paternal grandfather; Diabetes in her father, maternal grandmother, and maternal uncle; Heart disease in her maternal grandfather; Hypertension in her father and mother; Thyroid disease in her maternal grandmother.  ? ?Social history: ?Social History  ? ?Socioeconomic History  ? Marital status: Single  ?  Spouse name: Not on file  ? Number of children: Not on file  ? Years of education: Not on file  ? Highest education level: Not on file  ?Occupational History  ? Not on file  ?Tobacco Use  ? Smoking status: Never  ? Smokeless tobacco: Never  ?  Vaping Use  ? Vaping Use: Never used  ?Substance and Sexual Activity  ? Alcohol use: No  ? Drug use: No  ? Sexual activity: Never  ?  Birth control/protection: Abstinence  ?Other Topics Concern  ? Not on file  ?Social History Narrative  ? Dani Gobble:  ? Lives with mother.  ? Denies pets.  ? Denies smoke exposure  ?  Denies tobacco, drugs, or alcohol use  ? Hobbies: Mostly listens to music, does lots of writing, activity books, goes shopping  ? Went to [employer] school until 36 years old.  ? Brushes own teeth; mom helps her bathe; fixes own drinks; mom prepares meals - takes care of self and mom helps.  ? Denies sexual relationship.  ? Denies concern for STDs.  ? Caffeine none  ?   ? ?Social Determinants of Health  ? ?Financial Resource Strain: Not on file  ?Food Insecurity: Not on file  ?Transportation Needs: Not on file  ?Physical Activity: Not on file  ?Stress: Not on file  ?Social Connections: Not on file  ?Intimate Partner Violence: Not on file  ?  ?Past/failed meds: ? ?Allergies: ?No Known Allergies  ? ?Immunizations: ?Immunization History  ?Administered Date(s) Administered  ? Influenza Split 02/13/2012  ? Influenza,inj,Quad PF,6+ Mos 10/26/2012, 12/04/2013, 10/10/2014, 12/23/2015, 11/29/2016  ? PFIZER(Purple Top)SARS-COV-2 Vaccination 01/25/2020  ? Pneumococcal Polysaccharide-23 10/26/2012  ? Tdap 06/01/2012  ?  ?Diagnostics/Screenings: ?Copied from previous record: ?01/11/2014 - CT head noncontrast - Chronic stable congenital findings as described with a left ?posterior parietal ventriculostomy catheter unchanged. The ?ventriculostomy catheter position is unchanged along the right ?lateral border of a prominent midline CSF space which appears to ?communicate with the lateral ventricles as this is overall slightly ?smaller. ? ?Physical Exam: ?BP 112/70   Pulse 100   Wt 162 lb 4 oz (73.6 kg)   BMI 28.74 kg/m?   ?General: well developed, well nourished woman, seated on exam table, in no evident distress ?Head: misshapen head with oxycephaly, plagiocephaly, and megancelphaly. She has VP shunt and esotropia. Oropharynx benign.  ?Neck: supple ?Cardiovascular: regular rate and rhythm, no murmurs. ?Respiratory: clear to auscultation bilaterally ?Abdomen: bowel sounds present all four quadrants, abdomen soft, non-tender,  non-distended.  ?Musculoskeletal: no skeletal deformities or obvious scoliosis.  ?Skin: no rashes or neurocutaneous lesions. Mom pointed out areas of thin or absent hair on the crown of her head. ? ?Neurologic Exam ?Mental Status: awake and fully alert. Fund of knowledge subnormal for age. Speech with some dysarthria. Able to answer a few simple questions but unable to volunteer any information. Able to follow simple commands and participate in examination.  ?Cranial Nerves: fundoscopic exam - red reflex present.  Unable to fully visualize fundus.  Pupils equal briskly reactive to light.  Turns to localize faces and objects in the periphery. Turns to localize sounds in the periphery. Facial movements are symmetric.  ?Motor: normal functional bulk, tone and strength ?Sensory: withdrawal x 4 ?Coordination: unable to adequately assess due to patient's inability to participate in examination. No dysmetria when reaching for objects. ?Gait and Station: able to independently stand and bear weight. Stance and gait are wide based but otherwise fairly normal. Could not follow instructions for heel, toe and tandem walk ?Reflexes: diminished and symmetric. Toes neutral. No clonus  ? ?Impression: ?Seizure disorder ([place]) - Plan: [name] 300 MG 12 hr capsule, [name] (KEPPRA) 500 MG tablet ? 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 ?Congenital hydrocephalus ([place]) ? ?Intellectual delay ? ?S/P ventriculoperitoneal shunt  ? ?  Recommendations for plan of care: ?The patient's previous [facility] records were reviewed. Jeannine has neither had nor required imaging or lab studies since the last visit. She is a 36 year old woman with history of complex brain malformation, seizures, VP shunt, and intellectual delay. She is taking and tolerating brand [name] and generic [name] for her seizure disorder. She has occasional brief breakthrough seizures that do not require intervention. She is compliant with medication and  typically gets at least 8 hours of sleep each night. She has been having tacycardia and I encouraged Mom to keep all appointments for that evaluation. I will see Cherika back in follow up in 1 year or sooner if nee

## 2021-04-12 NOTE — Patient Instructions (Addendum)
It was a pleasure to see you today! ? ?Instructions for you until your next appointment are as follows: ?Continue Julina's medications as prescribed ?Let me know if she has any seizures ?Be sure to follow up with her PCP about her heart rate ?Please sign up for MyChart if you have not done so. ?Please plan to return for follow up in one year or sooner if needed. ? ?Feel free to contact our office during normal business hours at [phone] with questions or concerns. If there is no answer or the call is outside business hours, please leave a message and our clinic staff will call you back within the next business day.  If you have an urgent concern, please stay on the line for our after-hours answering service and ask for the on-call neurologist.   ?  ?I also encourage you to use MyChart to communicate with me more directly. If you have not yet signed up for MyChart within [facility], the front desk staff can help you. However, please note that this inbox is NOT monitored on nights or weekends, and response can take up to 2 business days.  Urgent matters should be discussed with the on-call pediatric neurologist.  ? ?At [facility], we are committed to providing exceptional care. You will receive a patient satisfaction survey through text or email regarding your visit today. Your opinion is important to me. Comments are appreciated.   ?

## 2021-04-18 ENCOUNTER — Encounter (INDEPENDENT_AMBULATORY_CARE_PROVIDER_SITE_OTHER): Payer: Self-pay | Admitting: Family

## 2021-04-21 ENCOUNTER — Ambulatory Visit (HOSPITAL_COMMUNITY): Payer: Medicare Other

## 2021-05-10 ENCOUNTER — Ambulatory Visit (HOSPITAL_COMMUNITY): Payer: Medicare Other

## 2021-05-25 ENCOUNTER — Ambulatory Visit (HOSPITAL_COMMUNITY)
Admission: RE | Admit: 2021-05-25 | Discharge: 2021-05-25 | Disposition: A | Discharge status: Home / Self Care | Payer: Medicare Other | Source: Ambulatory Visit | Attending: Registered Nurse | Admitting: Registered Nurse

## 2021-05-25 DIAGNOSIS — I10 Essential (primary) hypertension: Secondary | ICD-10-CM | POA: Diagnosis present

## 2021-05-25 DIAGNOSIS — E119 Type 2 diabetes mellitus without complications: Secondary | ICD-10-CM | POA: Diagnosis not present

## 2021-05-26 LAB — ECHOCARDIOGRAM COMPLETE
Area-P 1/2: 4.63 cm2
S' Lateral: 2.5 cm

## 2021-07-05 LAB — LAB REPORT - SCANNED: EGFR: 117

## 2021-10-28 ENCOUNTER — Other Ambulatory Visit: Payer: Self-pay | Admitting: Neurology

## 2021-10-28 DIAGNOSIS — G40909 Epilepsy, unspecified, not intractable, without status epilepticus: Secondary | ICD-10-CM

## 2022-03-26 ENCOUNTER — Other Ambulatory Visit: Payer: Self-pay | Admitting: Neurology

## 2022-03-26 DIAGNOSIS — G40909 Epilepsy, unspecified, not intractable, without status epilepticus: Secondary | ICD-10-CM

## 2022-03-28 NOTE — Telephone Encounter (Signed)
Last OV 3/272023 Next OV 04/18/2022 [name] last sent for 90 day supply rf x 2 11/12/2021. RN will send to provider to determine if she wants to send in the request amount or refills.

## 2022-03-29 LAB — LAB REPORT - SCANNED: EGFR: 110

## 2022-04-18 ENCOUNTER — Ambulatory Visit (INDEPENDENT_AMBULATORY_CARE_PROVIDER_SITE_OTHER): Payer: 59 | Admitting: Family

## 2022-05-24 ENCOUNTER — Ambulatory Visit (INDEPENDENT_AMBULATORY_CARE_PROVIDER_SITE_OTHER): Payer: 59 | Admitting: Family

## 2022-05-24 ENCOUNTER — Encounter (INDEPENDENT_AMBULATORY_CARE_PROVIDER_SITE_OTHER): Payer: Self-pay | Admitting: Family

## 2022-05-24 VITALS — BP 122/82 | HR 92 | Ht 62.99 in | Wt 157.6 lb

## 2022-05-24 DIAGNOSIS — L659 Nonscarring hair loss, unspecified: Secondary | ICD-10-CM | POA: Insufficient documentation

## 2022-05-24 DIAGNOSIS — Q039 Congenital hydrocephalus, unspecified: Secondary | ICD-10-CM

## 2022-05-24 DIAGNOSIS — F819 Developmental disorder of scholastic skills, unspecified: Secondary | ICD-10-CM

## 2022-05-24 DIAGNOSIS — Z982 Presence of cerebrospinal fluid drainage device: Secondary | ICD-10-CM

## 2022-05-24 DIAGNOSIS — G40909 Epilepsy, unspecified, not intractable, without status epilepticus: Secondary | ICD-10-CM

## 2022-05-24 DIAGNOSIS — Q043 Other reduction deformities of brain: Secondary | ICD-10-CM

## 2022-05-24 MED ORDER — CARBATROL 300 MG PO CP12: 600.0000 mg | ORAL_CAPSULE | Freq: Two times a day (BID) | ORAL | 3 refills | Status: DC

## 2022-05-24 MED ORDER — LEVETIRACETAM 500 MG PO TABS: ORAL_TABLET | ORAL | 3 refills | Status: DC

## 2022-05-24 NOTE — Patient Instructions (Signed)
It was a pleasure to see you today!  Instructions for you until your next appointment are as follows: Continue Kadience's medications as prescribed Call if seizures occur Please sign up for MyChart if you have not done so. Please plan to return for follow up in one year or sooner if needed.  Feel free to contact our office during normal business hours at [phone] with questions or concerns. If there is no answer or the call is outside business hours, please leave a message and our clinic staff will call you back within the next business day.  If you have an urgent concern, please stay on the line for our after-hours answering service and ask for the on-call neurologist.     I also encourage you to use MyChart to communicate with me more directly. If you have not yet signed up for MyChart within [facility], the front desk staff can help you. However, please note that this inbox is NOT monitored on nights or weekends, and response can take up to 2 business days.  Urgent matters should be discussed with the on-call pediatric neurologist.   At [facility], we are committed to providing exceptional care. You will receive a patient satisfaction survey through text or email regarding your visit today. Your opinion is important to me. Comments are appreciated.

## 2022-05-24 NOTE — Progress Notes (Signed)
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 She has focal motor seizures, intellectual delay and insomnia. She is taking and tolerating brand [name] and generic [name] for her seizure disorder, and has remained seizure free since March 2016, after adding [name] to her regimen in February 2016   Today's concerns: Has been experiencing patches of hair loss. She has an appointment with a dermatologist but Mom is frustrated that it is scheduled for next year.  Has remained seizure free.  At home with her mother. There are no problems with behavior.  [name] has been otherwise generally healthy since she was last seen. No health concerns today other than previously mentioned.  Review of systems: Please see HPI for neurologic and other pertinent review of systems. Otherwise all other systems were reviewed and were negative.  Problem List: Patient Active Problem List   Diagnosis Date Noted   Hair loss 05/24/2022   Intellectual delay 02/01/2017   S/P ventriculoperitoneal shunt 02/01/2017   Hyperlipidemia 06/06/2016   Anemia 06/06/2016   Keloid 07/24/2015   Congenital reduction deformities of brain (HCC) 10/03/2012   Obesity 06/02/2012   Routine adult health maintenance 04/21/2012   HTN (hypertension) 02/13/2012   Diabetes (HCC) 02/13/2012   Congenital  hydrocephalus (HCC) 02/05/2012   Seizure disorder (HCC) 02/05/2012     Past Medical History:  Diagnosis Date   Agenesis of corpus callosum (HCC)    Congenital hydrocephalus (HCC)    has VP shunt   Diabetes mellitus without complication (HCC)    Hypertension    Insomnia    Intellectual delay    Seizures (HCC)     Past medical history comments: See HPI  Surgical history: Past Surgical History:  Procedure Laterality Date   ORIF ANKLE FRACTURE Left 08/30/2016   Procedure: OPEN REDUCTION INTERNAL FIXATION (ORIF) ANKLE FRACTURE;  Surgeon: Sheral Apley, MD;  Location: [facility];  Service: Orthopedics;  Laterality: Left;   SHUNT REPLACEMENT  2011   Was readmitted 1 week later with peritoneal infection from shunt   WISDOM TOOTH EXTRACTION       Family history: family history includes Cancer in her maternal grandmother and paternal grandfather; Diabetes in her father, maternal grandmother, and maternal uncle; Heart disease in her maternal grandfather; Hypertension in her father and mother; Thyroid disease in her maternal grandmother.   Social history: Social History   Socioeconomic History   Marital status: Single    Spouse name: Not on file   Number of children: Not on file   Years of education: Not on file   Highest education level: Not on file  Occupational History   Not on file  Tobacco Use   Smoking status: Never   Smokeless tobacco: Never  Vaping Use   Vaping Use: Never used  Substance and Sexual Activity   Alcohol use: No   Drug use: No   Sexual activity: Never    Birth control/protection: Abstinence  Other Topics Concern  Not on file  Social History Narrative   Brendi:   Lives with mother.   Denies pets.   Denies smoke exposure   Denies tobacco, drugs, or alcohol use   Hobbies: Mostly listens to music, does lots of writing, activity books, goes shopping   Went to [employer] school until 37 years old.   Brushes own teeth; mom helps her  bathe; fixes own drinks; mom prepares meals - takes care of self and mom helps.   Denies sexual relationship.   Denies concern for STDs.   Caffeine none      Social Determinants of Health   Financial Resource Strain: Not on file  Food Insecurity: Not on file  Transportation Needs: Not on file  Physical Activity: Not on file  Stress: Not on file  Social Connections: Not on file  Intimate Partner Violence: Not on file    Past/failed meds:  Allergies: No Known Allergies   Immunizations: Immunization History  Administered Date(s) Administered   Influenza Split 02/13/2012   Influenza,inj,Quad PF,6+ Mos 10/26/2012, 12/04/2013, 10/10/2014, 12/23/2015, 11/29/2016   PFIZER(Purple Top)SARS-COV-2 Vaccination 01/25/2020   Pneumococcal Polysaccharide-23 10/26/2012   Tdap 06/01/2012    Diagnostics/Screenings: Copied from previous record: 01/11/2014 - CT head noncontrast - Chronic stable congenital findings as described with a left posterior parietal ventriculostomy catheter unchanged. The ventriculostomy catheter position is unchanged along the right lateral border of a prominent midline CSF space which appears to communicate with the lateral ventricles as this is overall slightly smaller.  Physical Exam: BP 122/82 (BP Location: Left Arm, Patient Position: Sitting, Cuff Size: Normal)   Pulse 92   Ht 5' 2.99" (1.6 m)   Wt 157 lb 9.6 oz (71.5 kg)   LMP 05/24/2022 (Exact Date)   BMI 27.92 kg/m   General: well developed, well nourished woman, seated on exam table, in no evident distress Head: misshapen head with oxycephaly, plagiocephaly, and megancephaly. She has VP shunt and esotropia. Oropharynx benign.  Neck: supple Cardiovascular: regular rate and rhythm, no murmurs. Respiratory: clear to auscultation bilaterally Abdomen: bowel sounds present all four quadrants, abdomen soft, non-tender, non-distended.  Musculoskeletal: no skeletal deformities or obvious scoliosis.  Skin: no  rashes or neurocutaneous lesions. Has some patches of very thin or absent hair on her scalp  Neurologic Exam Mental Status: awake and fully alert. Fund of knowledge subnormal for age. Speech with some articulation differences. Answer to answer some simple questions but unable to volunteer information. Able to follow simple commands and participate in examination Cranial Nerves: fundoscopic exam - red reflex present.  Unable to fully visualize fundus.  Pupils equal briskly reactive to light.  Turns to localize faces and objects in the periphery. Turns to localize sounds in the periphery. Facial movements are symmetric. Motor: normal functional bulk, tone and strength Sensory: withdrawal x 4 Coordination: unable to adequately assess due to patient's inability to participate in examination. No dysmetria when reaching for objects. Gait and Station: wide based stance and gait. Unable to follow instructions for heel, toe and tandem walk Reflexes: diminished and symmetric. Toes neutral. No clonus   Impression: Seizure disorder (HCC) - Plan: [name] 300 MG 12 hr capsule, [name] (KEPPRA) 500 MG tablet  Congenital hydrocephalus (HCC)  Congenital reduction deformities of brain (HCC)  S/P ventriculoperitoneal shunt  Intellectual delay  Hair loss   Recommendations for plan of care: The patient's previous Epic records were reviewed. No recent diagnostic studies to be reviewed with the patient.  Plan until next visit: Continue medications as prescribed  Reminded to be complaint with medication and to get at least 8 hours of sleep each night Call for questions or concerns Return in about 1 year (around 05/24/2023).  The medication list was reviewed and reconciled. No changes were made in the prescribed medications today. A complete medication list was provided to the patient.  Allergies as of 05/24/2022   No Known Allergies      Medication List        Accurate as of May 24, 2022 11:59  PM. If you have any questions, ask your nurse or doctor.          aspirin EC 81 MG tablet Take 1 tablet (81 mg total) by mouth daily.   [name] 300 MG 12 hr capsule Generic drug: carbamazepine Take 2 capsules (600 mg total) by mouth 2 (two) times daily. What changed: how much to take Changed by: Elveria Rising, NP   FeroSul 325 (65 FE) MG tablet Generic drug: ferrous sulfate TK 1 T PO  BID   fluticasone 50 MCG/ACT nasal spray Commonly known as: FLONASE Place 1 spray into both nostrils 2 (two) times daily.   glucose blood test strip Use as instructed   [name] 500 MG tablet Commonly known as: KEPPRA TAKE 1 TABLET(500 MG) BY MOUTH TWICE DAILY   lisinopril 20 MG tablet Commonly known as: ZESTRIL TAKE 1 TABLET(20 MG) BY MOUTH DAILY   Magnesium Oxide 400 MG Caps Take 1 capsule by mouth daily.   metFORMIN 1000 MG tablet Commonly known as: GLUCOPHAGE TAKE 1 TABLET BY MOUTH IN THE MORNING AND 1/2 TABLET IN THE EVENING   metoprolol succinate 100 MG 24 hr tablet Commonly known as: TOPROL-XL Take 1 tablet (100 mg total) by mouth daily. with food   onetouch ultrasoft lancets Use as instructed   rosuvastatin 10 MG tablet Commonly known as: CRESTOR Take 10 mg by mouth at bedtime.   VITAMIN D3 PO Take by mouth. 300      Total time spent with the patient was 20 minutes, of which 50% or more was spent in counseling and coordination of care.  Elveria Rising NP-C [facility] [address], [address] [place], [place] [address] Ph. [phone] Fax [phone]

## 2022-05-28 ENCOUNTER — Encounter (INDEPENDENT_AMBULATORY_CARE_PROVIDER_SITE_OTHER): Payer: Self-pay | Admitting: Family

## 2022-10-24 ENCOUNTER — Encounter: Payer: Self-pay | Admitting: *Deleted

## 2022-10-27 ENCOUNTER — Encounter: Payer: 59 | Admitting: Obstetrics and Gynecology

## 2023-01-25 ENCOUNTER — Ambulatory Visit: Payer: 59 | Admitting: Dermatology

## 2023-05-25 ENCOUNTER — Encounter (INDEPENDENT_AMBULATORY_CARE_PROVIDER_SITE_OTHER): Payer: Self-pay | Admitting: Family

## 2023-05-25 ENCOUNTER — Ambulatory Visit (INDEPENDENT_AMBULATORY_CARE_PROVIDER_SITE_OTHER): Payer: Self-pay | Admitting: Family

## 2023-05-25 VITALS — BP 120/72 | HR 100 | Ht 61.81 in | Wt 115.0 lb

## 2023-05-25 DIAGNOSIS — Z982 Presence of cerebrospinal fluid drainage device: Secondary | ICD-10-CM

## 2023-05-25 DIAGNOSIS — G40909 Epilepsy, unspecified, not intractable, without status epilepticus: Secondary | ICD-10-CM

## 2023-05-25 DIAGNOSIS — F819 Developmental disorder of scholastic skills, unspecified: Secondary | ICD-10-CM | POA: Diagnosis not present

## 2023-05-25 DIAGNOSIS — Q043 Other reduction deformities of brain: Secondary | ICD-10-CM | POA: Diagnosis not present

## 2023-05-25 DIAGNOSIS — Q039 Congenital hydrocephalus, unspecified: Secondary | ICD-10-CM

## 2023-05-25 MED ORDER — CARBATROL 300 MG PO CP12: 600.0000 mg | ORAL_CAPSULE | Freq: Two times a day (BID) | ORAL | 3 refills | Status: AC

## 2023-05-25 MED ORDER — LEVETIRACETAM 500 MG PO TABS: ORAL_TABLET | ORAL | 3 refills | Status: DC

## 2023-05-25 NOTE — Progress Notes (Signed)
 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 She has focal motor seizures, intellectual delay and insomnia. She is taking and tolerating brand [name]  and generic [name]  for her seizure disorder, and has remained seizure free since March 2016, after adding [name]  to her regimen in February 2016   Today's concerns: She has remained seizure free since her last visit  She reported hair loss at her last visit and is being followed by a dermatologist She is looking forward to an upcoming trip to [place]  to visit her father Marilea has been otherwise generally healthy since she was last seen. No health concerns today other than previously mentioned.  Review of systems: Please see HPI for neurologic and other pertinent review of systems. Otherwise all other systems were reviewed and were negative.  Problem List: Patient Active Problem List   Diagnosis Date Noted   Hair loss 05/24/2022   Intellectual delay 02/01/2017   S/P ventriculoperitoneal shunt 02/01/2017   Hyperlipidemia 06/06/2016   Anemia 06/06/2016   Keloid 07/24/2015   Congenital reduction deformities of brain (HCC) 10/03/2012   Obesity 06/02/2012   Routine adult health maintenance 04/21/2012   HTN (hypertension) 02/13/2012   Diabetes (HCC) 02/13/2012   Congenital hydrocephalus (HCC) 02/05/2012    Seizure disorder (HCC) 02/05/2012     Past Medical History:  Diagnosis Date   Agenesis of corpus callosum (HCC)    Congenital hydrocephalus (HCC)    has VP shunt   Diabetes mellitus without complication (HCC)    Hypertension    Insomnia    Intellectual delay    Seizures (HCC)     Past medical history comments: See HPI  Surgical history: Past Surgical History:  Procedure Laterality Date   ORIF ANKLE FRACTURE Left 08/30/2016   Procedure: OPEN REDUCTION INTERNAL FIXATION (ORIF) ANKLE FRACTURE;  Surgeon: Saundra Curl, MD;  Location: [facility];  Service: Orthopedics;  Laterality: Left;   SHUNT REPLACEMENT  2011   Was readmitted 1 week later with peritoneal infection from shunt   WISDOM TOOTH EXTRACTION       Family history: family history includes Cancer in her maternal grandmother and paternal grandfather; Diabetes in her father, maternal grandmother, and maternal uncle; Heart disease in her maternal grandfather; Hypertension in her father and mother; Thyroid  disease in her maternal grandmother.   Social history: Social History   Socioeconomic History   Marital status: Single    Spouse name: Not on file   Number of children: Not on file   Years of education: Not on file   Highest education level: Not on file  Occupational History   Not on file  Tobacco Use   Smoking status: Never   Smokeless tobacco: Never  Vaping Use   Vaping status: Never Used  Substance and Sexual Activity   Alcohol use: No   Drug use: No   Sexual activity: Never    Birth control/protection: Abstinence  Other Topics Concern   Not on file  Social History Narrative   Katelyn [name]:   Lives with mother.   Denies pets.   Denies smoke exposure   Denies tobacco, drugs, or alcohol use   Hobbies: Mostly listens to music, does lots of writing, activity books, goes shopping   Went to [employer] until 38 years old.   Brushes own teeth; mom helps her bathe; fixes own drinks; mom  prepares meals - takes care of self and mom helps.   Denies sexual relationship.   Denies concern for STDs.   Caffeine none      Social Drivers of Corporate investment banker Strain: Not on file  Food Insecurity: Not on file  Transportation Needs: Not on file  Physical Activity: Not on file  Stress: Not on file  Social Connections: Not on file  Intimate Partner Violence: Not on file    Past/failed meds:  Allergies: No Known Allergies   Immunizations: Immunization History  Administered Date(s) Administered   Influenza Split 02/13/2012   Influenza,inj,Quad PF,6+ Mos 10/26/2012, 12/04/2013, 10/10/2014, 12/23/2015, 11/29/2016   PFIZER(Purple Top)SARS-COV-2 Vaccination 01/25/2020   Pneumococcal Polysaccharide-23 10/26/2012   Tdap 06/01/2012    Diagnostics/Screenings: Copied from previous record: 01/11/2014 - CT head noncontrast - Chronic stable congenital findings as described with a left posterior parietal ventriculostomy catheter unchanged. The ventriculostomy catheter position is unchanged along the right lateral border of a prominent midline CSF space which appears to communicate with the lateral ventricles as this is overall slightly smaller.    Physical Exam: BP 120/72 (BP Location: Left Arm, Patient Position: Sitting, Cuff Size: Normal)   Pulse 100   Ht 5' 1.81" (1.57 m)   Wt 115 lb (52.2 kg)   LMP 05/25/2023 (Exact Date)   BMI 21.16 kg/m   General: well developed, well nourished woman, seated on exam table, in no evident distress Head: misshapen head with oxycephaly, plagiocephaly, and megancephaly. She has VP shunt and esotroptia. Wears glasses. Oropharynx benign.  Neck: supple Cardiovascular: regular rate and rhythm, no murmurs. Respiratory: clear to auscultation bilaterally Abdomen: bowel sounds present all four quadrants, abdomen soft, non-tender, non-distended.  Musculoskeletal: no skeletal deformities or obvious scoliosis.  Skin: no rashes or neurocutaneous  lesions  Neurologic Exam Mental Status: awake and fully alert. Fund of knowledge subnormal for age. Speech with some articulation differences. Able to answer simple questions but unable to contribute information on her own. Able to follow commands and participate in examination.  Cranial Nerves: fundoscopic exam - red reflex present.  Unable to fully visualize fundus.  Pupils equal briskly reactive to light.  Turns to localize faces and objects in the periphery. Turns to localize sounds in the periphery. Facial movements are symmetric Motor: normal functional bulk, tone and strength Sensory: withdrawal x 4 Coordination: balance adequate. No dysmetria with reach for objects.  Gait and Station: wide based gait and stance.   Impression: Seizure disorder (HCC) - Plan: [name]  300 MG 12 hr capsule, [name]  (KEPPRA ) 500 MG tablet  Congenital hydrocephalus (HCC)  Congenital reduction deformities of brain (HCC)  S/P ventriculoperitoneal shunt  Intellectual delay   Recommendations for plan of care: The patient's previous Epic records were reviewed. No recent diagnostic studies to be reviewed with the patient.  Plan until next visit: Continue medications as prescribed  Call for questions or concerns Return in about 1 year (around 05/24/2024).  The medication list was reviewed and reconciled. No changes were made in the prescribed medications today. A complete medication list was provided to the patient.  Allergies as  of 05/25/2023   No Known Allergies      Medication List        Accurate as of May 25, 2023 11:59 PM. If you have any questions, ask your nurse or doctor.          aspirin  EC 81 MG tablet Take 1 tablet (81 mg total) by mouth daily.   [name]  300 MG 12 hr capsule Generic drug: carbamazepine  Take 2 capsules (600 mg total) by mouth 2 (two) times daily.   FeroSul 325 (65 Fe) MG tablet Generic drug: ferrous sulfate TK 1 T PO  BID   fluticasone 50 MCG/ACT  nasal spray Commonly known as: FLONASE Place 1 spray into both nostrils 2 (two) times daily.   glucose blood test strip Use as instructed   [name]  500 MG tablet Commonly known as: KEPPRA  TAKE 1 TABLET(500 MG) BY MOUTH TWICE DAILY   lisinopril  20 MG tablet Commonly known as: ZESTRIL  TAKE 1 TABLET(20 MG) BY MOUTH DAILY   Magnesium Oxide 400 MG Caps Take 1 capsule by mouth daily.   metFORMIN  1000 MG tablet Commonly known as: GLUCOPHAGE  TAKE 1 TABLET BY MOUTH IN THE MORNING AND 1/2 TABLET IN THE EVENING   metoprolol  succinate 100 MG 24 hr tablet Commonly known as: TOPROL -XL Take 1 tablet (100 mg total) by mouth daily. with food   onetouch ultrasoft lancets Use as instructed   rosuvastatin 10 MG tablet Commonly known as: CRESTOR Take 10 mg by mouth at bedtime.   VITAMIN D3 PO Take by mouth. 300      Total time spent with the patient was 25 minutes, of which 50% or more was spent in counseling and coordination of care.  Lyndol Santee NP-C [facility] [address], [address] [place], [place] [address] Ph. [phone] Fax [phone]

## 2023-05-26 NOTE — Patient Instructions (Addendum)
 It was a pleasure to see you today!  Instructions for you until your next appointment are as follows: Continue your medications as prescribed Let me know if seizures occur or if you have any other questions Please sign up for MyChart if you have not done so. Please plan to return for follow up in 1 year or sooner if needed.  Feel free to contact our office during normal business hours at [phone] with questions or concerns. If there is no answer or the call is outside business hours, please leave a message and our clinic staff will call you back within the next business day.  If you have an urgent concern, please stay on the line for our after-hours answering service and ask for the on-call neurologist.     I also encourage you to use MyChart to communicate with me more directly. If you have not yet signed up for MyChart within [facility], the front desk staff can help you. However, please note that this inbox is NOT monitored on nights or weekends, and response can take up to 2 business days.  Urgent matters should be discussed with the on-call pediatric neurologist.   At [facility], we are committed to providing exceptional care. You will receive a patient satisfaction survey through text or email regarding your visit today. Your opinion is important to me. Comments are appreciated.

## 2023-05-28 ENCOUNTER — Encounter (INDEPENDENT_AMBULATORY_CARE_PROVIDER_SITE_OTHER): Payer: Self-pay | Admitting: Family

## 2023-06-05 ENCOUNTER — Encounter: Payer: Self-pay | Admitting: Dermatology

## 2023-06-05 ENCOUNTER — Ambulatory Visit: Admitting: Dermatology

## 2023-06-05 VITALS — BP 124/78

## 2023-06-05 DIAGNOSIS — L658 Other specified nonscarring hair loss: Secondary | ICD-10-CM

## 2023-06-05 MED ORDER — SAFETY SEAL MISCELLANEOUS MISC: 4 refills | Status: AC

## 2023-06-05 NOTE — Progress Notes (Signed)
   New Patient Visit   Subjective  Katelyn [name] is a 38 y.o. female accompanied by mother who presents for the following: New Pt - Hair Loss  Patient states she has hair loss located at the scalp that she would like to have examined. Patient reports the areas have been there for 3 years. Mother stated that hair loss is mainly in the crown but also noticeable at the hairline. She stated the hair grows back but comes outs in clumps when combed. Mother stated that pt frequently wore braids as she has long thick hair and it helped maintain. She reports the areas are not bothersome. Patient rates irritation 0 out of 10. She states that the areas have not spread. Patient reports she has not previously been treated for these areas. However, she has taken OTC hair vitamins, used a variation of deep conditioners. Patient denied Hx of bx. Patient denied family history of skin cancer(s).   The following portions of the chart were reviewed this encounter and updated as appropriate: medications, allergies, medical history  Review of Systems:  No other skin or systemic complaints except as noted in HPI or Assessment and Plan.  Objective  Well appearing patient in no apparent distress; mood and affect are within normal limits.   A focused examination was performed of the following areas: scalp   Relevant exam findings are noted in the Assessment and Plan.              Assessment & Plan   TRACTION ALOPECIA    - Assessment: Patient presents with hair thinning for 3 years and a new bald spot that has not regrown. Hair loss worsened approximately 1 year after COVID infection. Patient reports excessive shedding during hair washing. Hair styling practices include occasional buns or leaving hair to flow. No history of chemical relaxers or frequent heat treatments. Family history of hair loss on paternal side and possibly medication-related on maternal aunt's side. Based on the clinical presentation  and history, the diagnosis is traction alopecia, likely caused by chronic inflammation over time.    Treatment Plan:    Prescribe minoxidil/clobetasol compound from [facility]     - Apply in the morning using a cotton ball    Recommend Vital Proteins collagen powder as a supplement for hair growth    Continue using gentle shampoos and conditioners    Follow-up in 4 months to assess progress    Discuss other treatment options at follow-up if needed  Long term medication management.  Patient is using long term (months to years) prescription medication  to control their dermatologic condition.  These medications require periodic monitoring to evaluate for efficacy and side effects and may require periodic laboratory monitoring.   TRACTION ALOPECIA   Related Medications Safety Seal Miscellaneous MISC AA Gel - Minoxidil USP 8% Clobetasol USP 0.05% - apply to affected areas of the scalp every morning with a cotton ball   Return in about 4 months (around 10/06/2023) for traction alopecia.   Documentation: I have reviewed the above documentation for accuracy and completeness, and I agree with the above.  I, Shirron Louanne Roussel, CMA, am acting as scribe for [employer], DO.   Louana Roup, DO

## 2023-06-05 NOTE — Patient Instructions (Addendum)
 Date: Mon Jun 05 2023  Hello Franchot Ion,  Thank you for visiting today. Here is a summary of the key instructions:  Medications: - Apply minoxidil/Clobetasol  in the morning using a cotton ball to control inflammation   -  - [facility] will contact you for payment and delivery  Supplements: - Consider adding Vital Proteins collagen powder to smoothies to boost hair growth  Hair Care: - Continue using gentle shampoos and conditioners  Follow-up: - Return for a follow-up appointment in 4 months to assess progress  We look forward to seeing the positive changes in your next visit. If you have any questions or concerns before then, please do not hesitate to contact our office.  Warm regards,  Dr. Louana Roup, Dermatology          Important Information  Due to recent changes in healthcare laws, you may see results of your pathology and/or laboratory studies on MyChart before the doctors have had a chance to review them. We understand that in some cases there may be results that are confusing or concerning to you. Please understand that not all results are received at the same time and often the doctors may need to interpret multiple results in order to provide you with the best plan of care or course of treatment. Therefore, we ask that you please give us  2 business days to thoroughly review all your results before contacting the office for clarification. Should we see a critical lab result, you will be contacted sooner.   If You Need Anything After Your Visit  If you have any questions or concerns for your doctor, please call our main line at [phone] If no one answers, please leave a voicemail as directed and we will return your call as soon as possible. Messages left after 4 pm will be answered the following business day.   You may also send us  a message via MyChart. We typically respond to MyChart messages within 1-2 business days.  For prescription refills,  please ask your pharmacy to contact our office. Our fax number is [phone].  If you have an urgent issue when the clinic is closed that cannot wait until the next business day, you can page your doctor at the number below.    Please note that while we do our best to be available for urgent issues outside of office hours, we are not available 24/7.   If you have an urgent issue and are unable to reach us , you may choose to seek medical care at your doctor's office, retail clinic, urgent care center, or emergency room.  If you have a medical emergency, please immediately call 911 or go to the emergency department. In the event of inclement weather, please call our main line at [phone] for an update on the status of any delays or closures.  Dermatology Medication Tips: Please keep the boxes that topical medications come in in order to help keep track of the instructions about where and how to use these. Pharmacies typically print the medication instructions only on the boxes and not directly on the medication tubes.   If your medication is too expensive, please contact our office at [phone] or send us  a message through MyChart.   We are unable to tell what your co-pay for medications will be in advance as this is different depending on your insurance coverage. However, we may be able to find a substitute medication at lower cost or fill out paperwork to get insurance to cover  a needed medication.   If a prior authorization is required to get your medication covered by your insurance company, please allow us  1-2 business days to complete this process.  Drug prices often vary depending on where the prescription is filled and some pharmacies may offer cheaper prices.  The website www.goodrx.com contains coupons for medications through different pharmacies. The prices here do not account for what the cost may be with help from insurance (it may be cheaper with your insurance), but the  website can give you the price if you did not use any insurance.  - You can print the associated coupon and take it with your prescription to the pharmacy.  - You may also stop by our office during regular business hours and pick up a GoodRx coupon card.  - If you need your prescription sent electronically to a different pharmacy, notify our office through [facility] or by phone at [phone]

## 2023-06-22 ENCOUNTER — Ambulatory Visit: Payer: 59 | Admitting: Dermatology

## 2023-10-09 ENCOUNTER — Ambulatory Visit: Admitting: Dermatology

## 2024-02-07 ENCOUNTER — Other Ambulatory Visit (INDEPENDENT_AMBULATORY_CARE_PROVIDER_SITE_OTHER): Payer: Self-pay | Admitting: Family

## 2024-02-07 DIAGNOSIS — G40909 Epilepsy, unspecified, not intractable, without status epilepticus: Secondary | ICD-10-CM

## 2024-05-27 ENCOUNTER — Ambulatory Visit (INDEPENDENT_AMBULATORY_CARE_PROVIDER_SITE_OTHER): Payer: Self-pay | Admitting: Family
